# Patient Record
Sex: Male | Born: 1955 | Race: Black or African American | Hispanic: No | Smoking: Former smoker
Health system: Southern US, Community
[De-identification: ages and names within clinical notes are randomized; demographics above are authoritative.]

## PROBLEM LIST (undated history)

## (undated) DIAGNOSIS — E785 Hyperlipidemia, unspecified: Secondary | ICD-10-CM

## (undated) DIAGNOSIS — I509 Heart failure, unspecified: Secondary | ICD-10-CM

## (undated) DIAGNOSIS — F319 Bipolar disorder, unspecified: Secondary | ICD-10-CM

## (undated) DIAGNOSIS — Z720 Tobacco use: Secondary | ICD-10-CM

## (undated) DIAGNOSIS — I10 Essential (primary) hypertension: Secondary | ICD-10-CM

## (undated) DIAGNOSIS — F32A Depression, unspecified: Secondary | ICD-10-CM

## (undated) DIAGNOSIS — F329 Major depressive disorder, single episode, unspecified: Secondary | ICD-10-CM

## (undated) HISTORY — DX: Heart failure, unspecified: I50.9

## (undated) HISTORY — DX: Bipolar disorder, unspecified: F31.9

---
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
## 2011-12-22 ENCOUNTER — Encounter (HOSPITAL_COMMUNITY): Payer: Self-pay | Admitting: *Deleted

## 2011-12-22 ENCOUNTER — Emergency Department (HOSPITAL_COMMUNITY)
Admission: EM | Admit: 2011-12-22 | Discharge: 2011-12-22 | Disposition: A | Discharge status: Home / Self Care | Payer: Self-pay | Attending: Emergency Medicine | Admitting: Emergency Medicine

## 2011-12-22 DIAGNOSIS — M7989 Other specified soft tissue disorders: Secondary | ICD-10-CM

## 2011-12-22 DIAGNOSIS — I10 Essential (primary) hypertension: Secondary | ICD-10-CM | POA: Insufficient documentation

## 2011-12-22 DIAGNOSIS — Z9089 Acquired absence of other organs: Secondary | ICD-10-CM | POA: Insufficient documentation

## 2011-12-22 DIAGNOSIS — R600 Localized edema: Secondary | ICD-10-CM

## 2011-12-22 DIAGNOSIS — R609 Edema, unspecified: Secondary | ICD-10-CM | POA: Insufficient documentation

## 2011-12-22 HISTORY — DX: Essential (primary) hypertension: I10

## 2011-12-22 LAB — BASIC METABOLIC PANEL
Chloride: 102 mEq/L (ref 96–112)
GFR calc Af Amer: >90 mL/min (ref >90)
Potassium: 3.4 mEq/L — ABNORMAL LOW (ref 3.5–5.1)
Sodium: 138 mEq/L (ref 135–145)

## 2011-12-22 LAB — PRO B NATRIURETIC PEPTIDE: Pro B Natriuretic peptide (BNP): 5 pg/mL (ref 0–125)

## 2011-12-22 MED ORDER — FUROSEMIDE 40 MG PO TABS: 40.0000 mg | ORAL_TABLET | Freq: Every day | ORAL | Status: DC

## 2011-12-22 NOTE — ED Notes (Addendum)
Here for bilateral feet swelling, onset 1 week ago, pain onset last night in R foot. No h/o same. Swelling up to knees, pitting +3. Pedal pulses palpable. Other with pt states: "he does not sleep very well, up on his feet all the time, does not take his BP medicine, has not had it in months".

## 2011-12-22 NOTE — Progress Notes (Signed)
*  Preliminary Results* Bilateral lower extremity venous duplex completed. Bilateral lower extremities are negative for deep vein thrombosis.  12/22/2011 9:33 AM Gertie Fey, RDMS, RDCS

## 2011-12-22 NOTE — ED Provider Notes (Signed)
History     CSN: [id number]  Arrival date & time 12/22/11  0442   First MD Initiated Contact with Patient 12/22/11 0544      Chief Complaint  Patient presents with  . Leg Swelling    (Consider location/radiation/quality/duration/timing/severity/associated sxs/prior treatment) The history is provided by the patient and a friend.   Neeraj Housand is a 56 y.o. male presents to the emergency department complaining of bilateral leg swelling.  The onset of the symptoms was  abrupt starting 6 days ago.  The patient has associated pain in the legs.  The symptoms have been  persistent, gradually worsened.  nothing makes the symptoms worse and nothing makes symptoms better.  The patient denies fever, chills, headache, chest pain, shortness of breath, dyspnea, dyspnea on exertion, orthopnea, nausea, vomiting, diarrhea.  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 He states he's on his feet throughout most of the day.  He states last week and his right leg began swelling and the trochars later his left leg had begun to swell as well.  He states he's tried elevation and Epson salt soaks without relief. He states the legs are painful particularly in the feet.  He states he's not been sleeping very well however he does not wake up short of breath or coughing.  He has no difficulty lying flat.  He takes only vitamins D, C, B6, B12.  He denies medical problems.  He states he does not take his BP medications.  Denies SOB, hemoptysis, chest pain.     Past Medical History  Diagnosis Date  . Hypertension     Past Surgical History  Procedure Date  . Splenectomy   . Appendectomy     History reviewed. No pertinent family history.  History  Substance Use Topics  . Smoking status: Never Smoker   . Smokeless tobacco: Not on file  . Alcohol Use: No      Review of Systems  Constitutional: Negative for fever, diaphoresis, appetite change, fatigue and unexpected weight change.  HENT: Negative  for mouth sores and neck stiffness.   Eyes: Negative for visual disturbance.  Respiratory: Negative for cough, chest tightness, shortness of breath and wheezing.   Cardiovascular: Positive for leg swelling. Negative for chest pain.  Gastrointestinal: Negative for nausea, vomiting, abdominal pain, diarrhea and constipation.  Genitourinary: Negative for dysuria, urgency, frequency and hematuria.  Musculoskeletal: Negative for back pain and gait problem.  Skin: Negative for rash.  Neurological: Negative for syncope, light-headedness and headaches.  Psychiatric/Behavioral: Positive for disturbed wake/sleep cycle. The patient is not nervous/anxious.     Allergies  Review of patient's allergies indicates no known allergies.  Home Medications   Current Outpatient Rx  Name Route Sig Dispense Refill  . FUROSEMIDE 40 MG PO TABS Oral Take 1 tablet (40 mg total) by mouth daily. 30 tablet 1    BP 137/82  Pulse 78  Temp 98 F (36.7 C) (Oral)  Resp 16  SpO2 99%  Physical Exam  Nursing note and vitals reviewed. Constitutional: He appears well-developed and well-nourished. No distress.  HENT:  Head: Normocephalic and atraumatic.  Mouth/Throat: Oropharynx is clear and moist. No oropharyngeal exudate.  Eyes: Conjunctivae normal and EOM are normal. Pupils are equal, round, and reactive to light. No scleral icterus.  Neck: Normal range of motion. Neck supple.  Cardiovascular: Normal rate, regular rhythm, S1 normal, S2 normal, normal heart sounds and intact distal pulses.  Exam reveals no gallop and no friction rub.  No murmur heard.      Capillary refill < 3 sec 3+ pitting edema of the legs bilaterally from the forefoot to mid calf, general swelling to above the knees  Pulmonary/Chest: Effort normal and breath sounds normal. No accessory muscle usage. No respiratory distress. He has no decreased breath sounds. He has no wheezes. He has no rhonchi. He has no rales.  Abdominal: Soft. Bowel sounds  are normal. He exhibits no mass. There is no tenderness. There is no rebound and no guarding.  Musculoskeletal: Normal range of motion. He exhibits edema.  Lymphadenopathy:    He has no cervical adenopathy.  Neurological: He is alert. No cranial nerve deficit. He exhibits normal muscle tone. Coordination normal.       Speech is clear and goal oriented Moves extremities without ataxia  Skin: Skin is warm and dry. No rash noted. He is not diaphoretic.  Psychiatric: He has a normal mood and affect.    ED Course  Procedures (including critical care time)  Labs Reviewed  BASIC METABOLIC PANEL - Abnormal; Notable for the following:    Potassium 3.4 (*)     All other components within normal limits  PRO B NATRIURETIC PEPTIDE   No results found.  Results for orders placed during the hospital encounter of 12/22/11  PRO B NATRIURETIC PEPTIDE      Component Value Range   Pro B Natriuretic peptide (BNP) 5.0  0 - 125 pg/mL  BASIC METABOLIC PANEL      Component Value Range   Sodium 138  135 - 145 mEq/L   Potassium 3.4 (*) 3.5 - 5.1 mEq/L   Chloride 102  96 - 112 mEq/L   CO2 28  19 - 32 mEq/L   Glucose, Bld 96  70 - 99 mg/dL   BUN 8  6 - 23 mg/dL   Creatinine, Ser 1.61  0.50 - 1.35 mg/dL   Calcium 9.5  8.4 - 09.6 mg/dL   GFR calc non Af Amer >90  >90 mL/min   GFR calc Af Amer >90  >90 mL/min   No results found.   1. Bilateral leg edema       MDM  Clovis Cao for bilateral leg swelling.  Concern for DVT versus mild heart failure.  Patient low risk for Wells criteria for DVT.  No pulmonary symptoms present.  Alert and oriented, nontoxic nonseptic appearing.  BMP largely unremarkable; mild hypokalemia at 3.4.  I discussed diet choices for increasing this.  BNP WNL; no evidence of heart failure.  Duplex Scan: Bilateral lower extremities are negative for deep vein thrombosis.  Pt is not jaundiced and there is no evidence of liver failure on exam. Will discharge home with a trial of  lasix and have him follow-up with a PCP.  I have given him a resource list to use for finding a PCP.  I have also discussed reasons to return immediately to the ER.  Patient expresses understanding and agrees with plan.  1. Medications: lasix 2. Treatment: rest, adequate hydration, decrease salt intake, can continue salt soaks 3. Follow Up: with a PCP.              Dahlia Client Zackery Brine, PA-C 12/22/11 [phone]

## 2011-12-22 NOTE — ED Notes (Signed)
Pt returned from vascular study.

## 2011-12-22 NOTE — ED Notes (Signed)
Pt denies SOB. Bilateral Lower extremities swollen, and experiencing rt foot pain. Symptoms x 2 weeks.

## 2011-12-23 NOTE — ED Provider Notes (Signed)
Medical screening examination/treatment/procedure(s) were performed by non-physician practitioner and as supervising physician I was immediately available for consultation/collaboration.  Jones Skene, M.D.     Jones Skene, MD 12/23/11 [phone]

## 2011-12-24 ENCOUNTER — Emergency Department (HOSPITAL_COMMUNITY)
Admission: EM | Admit: 2011-12-24 | Discharge: 2011-12-25 | Disposition: A | Discharge status: Home / Self Care | Payer: Self-pay | Attending: Emergency Medicine | Admitting: Emergency Medicine

## 2011-12-24 ENCOUNTER — Encounter (HOSPITAL_COMMUNITY): Payer: Self-pay | Admitting: Nurse Practitioner

## 2011-12-24 ENCOUNTER — Emergency Department (HOSPITAL_COMMUNITY): Payer: Self-pay

## 2011-12-24 DIAGNOSIS — R609 Edema, unspecified: Secondary | ICD-10-CM | POA: Insufficient documentation

## 2011-12-24 DIAGNOSIS — Z79899 Other long term (current) drug therapy: Secondary | ICD-10-CM | POA: Insufficient documentation

## 2011-12-24 DIAGNOSIS — I10 Essential (primary) hypertension: Secondary | ICD-10-CM | POA: Insufficient documentation

## 2011-12-24 LAB — URINALYSIS, ROUTINE W REFLEX MICROSCOPIC
Leukocytes, UA: NEGATIVE
Nitrite: NEGATIVE
Specific Gravity, Urine: 1.031 — ABNORMAL HIGH (ref 1.005–1.030)
pH: 5.5 (ref 5.0–8.0)

## 2011-12-24 NOTE — ED Notes (Signed)
Pt reports he was here Wednesday for swelling in legs, sent home and told to f/u with a PCP. Pt called referrals and the nurse he spoke to told him to return to ed for further eval. States symptoms remain the same as Wednesday: swelling in BLE. C/o pain in R foot. No sob or CP. A&Ox4, resp e/u

## 2011-12-25 NOTE — ED Notes (Signed)
Pt denies any questions upon discharge. 

## 2011-12-25 NOTE — ED Provider Notes (Signed)
History     CSN: [id number]  Arrival date & time 12/24/11  4098   First MD Initiated Contact with Patient 12/24/11 2300      Chief Complaint  Patient presents with  . Leg Swelling    (Consider location/radiation/quality/duration/timing/severity/associated sxs/prior treatment) HPI Pt seen two days ago in ED with c/o lower extremity swelling.  Pt with extensive workup at that time, no signs of chf, no dvt.  Pt was to f/u outpatient, started on lasix.  Pt was told by nurse at new pcm office to go to ED for further eval.  No new symptoms, denies cp, sob.  Legs still swollen.    Past Medical History  Diagnosis Date  . Hypertension     Past Surgical History  Procedure Date  . Splenectomy   . Appendectomy     History reviewed. No pertinent family history.  History  Substance Use Topics  . Smoking status: Never Smoker   . Smokeless tobacco: Not on file  . Alcohol Use: No      Review of Systems  All other systems reviewed and are negative.    Allergies  Review of patient's allergies indicates no known allergies.  Home Medications   Current Outpatient Rx  Name Route Sig Dispense Refill  . FUROSEMIDE 40 MG PO TABS Oral Take 1 tablet (40 mg total) by mouth daily. 30 tablet 1    BP 140/80  Pulse 71  Temp 98.1 F (36.7 C) (Oral)  Resp 18  SpO2 100%  Physical Exam  Nursing note and vitals reviewed. Constitutional: He is oriented to person, place, and time. He appears well-developed and well-nourished.  HENT:  Head: Normocephalic and atraumatic.  Nose: Nose normal.  Mouth/Throat: Oropharynx is clear and moist.  Eyes: Conjunctivae normal and EOM are normal. Pupils are equal, round, and reactive to light.  Neck: Normal range of motion. Neck supple. No JVD present. No tracheal deviation present. No thyromegaly present.  Cardiovascular: Normal rate, regular rhythm, normal heart sounds and intact distal pulses.  Exam reveals no gallop and no friction rub.   No  murmur heard. Pulmonary/Chest: Effort normal and breath sounds normal. No stridor. No respiratory distress. He has no wheezes. He has no rales. He exhibits no tenderness.  Abdominal: Soft. Bowel sounds are normal. He exhibits no distension and no mass. There is no tenderness. There is no rebound and no guarding.  Musculoskeletal: Normal range of motion. He exhibits edema (2+pitting edema to mid shin). He exhibits no tenderness.  Lymphadenopathy:    He has no cervical adenopathy.  Neurological: He is alert and oriented to person, place, and time. He exhibits normal muscle tone. Coordination normal.  Skin: Skin is dry. No rash noted. No erythema. No pallor.  Psychiatric: He has a normal mood and affect. His behavior is normal. Judgment and thought content normal.    ED Course  Procedures (including critical care time)  Labs Reviewed  URINALYSIS, ROUTINE W REFLEX MICROSCOPIC - Abnormal; Notable for the following:    APPearance CLOUDY (*)     Specific Gravity, Urine 1.031 (*)     All other components within normal limits  LAB REPORT - SCANNED   Dg Chest 2 View  12/24/2011  *RADIOLOGY REPORT*  Clinical Data: Bilateral leg swelling  CHEST - 2 VIEW  Comparison: None.  Findings: Bronchitic changes.  No focal consolidation.  No frank interstitial edema.  No pleural effusion or pneumothorax.  Cardiomediastinal silhouette is within normal limits.  Mild degenerative changes of  the visualized thoracolumbar spine.  IMPRESSION: No evidence of acute cardiopulmonary disease.   Original Report Authenticated By: Charline Bills, M.D.      1. Peripheral edema       MDM  Pt with thorough ED workup 2 days ago.  UA and CXR done today.  No signs of fluid overload, no renal disease noted, no dvt, no cellulitis on exam.  Pt on diuretic.  He was noted to be slightly htn on exam today, no medications started at this time.  Wrote script for compression stockings.  Pt informed little workup that can be done  emergently, and will need to f/u with pcm.        Olivia Mackie, MD 12/25/11 2053

## 2012-01-14 ENCOUNTER — Emergency Department (HOSPITAL_COMMUNITY)
Admission: EM | Admit: 2012-01-14 | Discharge: 2012-01-15 | Disposition: A | Discharge status: Home / Self Care | Payer: Self-pay | Attending: Emergency Medicine | Admitting: Emergency Medicine

## 2012-01-14 ENCOUNTER — Encounter (HOSPITAL_COMMUNITY): Payer: Self-pay | Admitting: *Deleted

## 2012-01-14 DIAGNOSIS — I10 Essential (primary) hypertension: Secondary | ICD-10-CM | POA: Insufficient documentation

## 2012-01-14 DIAGNOSIS — F319 Bipolar disorder, unspecified: Secondary | ICD-10-CM | POA: Insufficient documentation

## 2012-01-14 LAB — BASIC METABOLIC PANEL
BUN: 9 mg/dL (ref 6–23)
Chloride: 100 mEq/L (ref 96–112)
GFR calc Af Amer: >90 mL/min (ref >90)
Potassium: 3.5 mEq/L (ref 3.5–5.1)
Sodium: 137 mEq/L (ref 135–145)

## 2012-01-14 LAB — CBC WITH DIFFERENTIAL/PLATELET
Basophils Relative: 1 % (ref 0–1)
Hemoglobin: 13.5 g/dL (ref 13.0–17.0)
Lymphs Abs: 2 10*3/uL (ref 0.7–4.0)
Monocytes Relative: 8 % (ref 3–12)
Neutro Abs: 5 10*3/uL (ref 1.7–7.7)
Neutrophils Relative %: 65 % (ref 43–77)
Platelets: 344 10*3/uL (ref 150–400)
RBC: 4.79 MIL/uL (ref 4.22–5.81)

## 2012-01-14 LAB — RAPID URINE DRUG SCREEN, HOSP PERFORMED
Barbiturates: NOT DETECTED
Benzodiazepines: NOT DETECTED
Cocaine: NOT DETECTED
Opiates: NOT DETECTED

## 2012-01-14 MED ORDER — LORAZEPAM 1 MG PO TABS
1.0000 mg | ORAL_TABLET | Freq: Once | ORAL | Status: AC
  Administered 2012-01-14: 1 mg via ORAL
  Filled 2012-01-14: qty 2

## 2012-01-14 NOTE — ED Provider Notes (Signed)
History     CSN: [id number]  Arrival date & time 01/14/12  1819   First MD Initiated Contact with Patient 01/14/12 1958      Chief Complaint  Patient presents with  . Anxiety   HPI  History provided by the patient and patient's son Francee Piccolo. Patient is a 56 year old male with history of hypertension and possible prior history of bipolar disorder. Patient states he was brought here by his son's because he needed to "cool down". He states that he was an argument with his son and that he gets overly anxious and begins to yell at times. Patient currently reports feeling much better. He does state that his son has kicked him out of the house and he has no place to live. Patient's son, Francee Piccolo spoke with me on the phone and states that he believes patient may have past history of bipolar disorder that he was diagnosed and treated for her while in prison. Patient has been living with him and his other brother off-and-on. Patient stopped taking medications that he was on in prison was coming home stating that this was "demon medicine" and that he would not take it.  Francee Piccolo also reports that his father has been sneaking out at night and wandering the streets. Patient also had reported to him that he he believes everyone "wants him" and that he could get any woman he wanted.  Patient has also spent a long periods of time sitting and talking to himself. Patient currently denies any hallucinations to me. Denies any depression denies any SI or HI.    Past Medical History  Diagnosis Date  . Hypertension     Past Surgical History  Procedure Date  . Splenectomy   . Appendectomy     No family history on file.  History  Substance Use Topics  . Smoking status: Never Smoker   . Smokeless tobacco: Not on file  . Alcohol Use: No      Review of Systems  Psychiatric/Behavioral: Positive for agitation. Negative for suicidal ideas and hallucinations. The patient is nervous/anxious.   All other systems  reviewed and are negative.    Allergies  Review of patient's allergies indicates no known allergies.  Home Medications   Current Outpatient Rx  Name Route Sig Dispense Refill  . FUROSEMIDE 40 MG PO TABS Oral Take 1 tablet (40 mg total) by mouth daily. 30 tablet 1    BP 123/73  Pulse 76  Temp 98.1 F (36.7 C) (Oral)  Resp 18  SpO2 9%  Physical Exam  Nursing note and vitals reviewed. Constitutional: He is oriented to person, place, and time. He appears well-developed and well-nourished. No distress.  HENT:  Head: Normocephalic.  Eyes: EOM are normal. Pupils are equal, round, and reactive to light.  Cardiovascular: Normal rate and regular rhythm.   Pulmonary/Chest: Effort normal and breath sounds normal.  Abdominal: Soft. There is no tenderness.  Musculoskeletal: He exhibits edema. He exhibits no tenderness.  Neurological: He is alert and oriented to person, place, and time.  Skin: Skin is warm. No rash noted. No erythema.  Psychiatric: His mood appears anxious. He is is hyperactive. He is not actively hallucinating. He expresses impulsivity and inappropriate judgment. He expresses no homicidal and no suicidal ideation.    ED Course  Procedures   Results for orders placed during the hospital encounter of 01/14/12  CBC WITH DIFFERENTIAL      Component Value Range   WBC 7.8  4.0 - 10.5 K/uL  RBC 4.79  4.22 - 5.81 MIL/uL   Hemoglobin 13.5  13.0 - 17.0 g/dL   HCT 16.1  09.6 - 04.5 %   MCV 85.2  78.0 - 100.0 fL   MCH 28.2  26.0 - 34.0 pg   MCHC 33.1  30.0 - 36.0 g/dL   RDW 40.9  81.1 - 91.4 %   Platelets 344  150 - 400 K/uL   Neutrophils Relative 65  43 - 77 %   Neutro Abs 5.0  1.7 - 7.7 K/uL   Lymphocytes Relative 26  12 - 46 %   Lymphs Abs 2.0  0.7 - 4.0 K/uL   Monocytes Relative 8  3 - 12 %   Monocytes Absolute 0.6  0.1 - 1.0 K/uL   Eosinophils Relative 1  0 - 5 %   Eosinophils Absolute 0.1  0.0 - 0.7 K/uL   Basophils Relative 1  0 - 1 %   Basophils Absolute  0.0  0.0 - 0.1 K/uL  BASIC METABOLIC PANEL      Component Value Range   Sodium 137  135 - 145 mEq/L   Potassium 3.5  3.5 - 5.1 mEq/L   Chloride 100  96 - 112 mEq/L   CO2 28  19 - 32 mEq/L   Glucose, Bld 79  70 - 99 mg/dL   BUN 9  6 - 23 mg/dL   Creatinine, Ser 7.82  0.50 - 1.35 mg/dL   Calcium 9.9  8.4 - 95.6 mg/dL   GFR calc non Af Amer >90  >90 mL/min   GFR calc Af Amer >90  >90 mL/min  ETHANOL      Component Value Range   Alcohol, Ethyl (B) <11  0 - 11 mg/dL       1. Bipolar 1 disorder       MDM  9:15PM patient seen and evaluated. Patient sitting in chair appears comfortable and calm.   Will plan to have tele-psych consult.  Patient was evaluated by Dr. Gary Fleet.  Patient diagnosed with bipolar 1 disorder with manic curvature is 6. This time patient felt stable and safe to return home. Dr. Trisha Mangle has given recommendations for new medications of Haldol 5 mg at night as well as Tegretol 200 mg twice a day.  Patient given prescriptions for new medications and referrals for outpatient mental health and drug abuse.     Angus Seller, [place] 01/15/12 [phone]

## 2012-01-14 NOTE — ED Notes (Signed)
Anxiety for years.  He feels more anxious today,  He lives in a halfway hiuse

## 2012-01-15 MED ORDER — CARBAMAZEPINE 200 MG PO TABS: 200.0000 mg | ORAL_TABLET | Freq: Two times a day (BID) | ORAL | Status: DC

## 2012-01-15 MED ORDER — HALOPERIDOL 5 MG PO TABS: 5.0000 mg | ORAL_TABLET | Freq: Every day | ORAL | Status: DC

## 2012-01-15 NOTE — ED Notes (Signed)
Pt reports he was in an argument w/ his son and has caused him to be anxious - per son pt has been incarcerated and when he was let out came to live w/ him. Pt has been assisted in obtaining a job w/ [employer] that did not work out d/t pt just sitting and talking to himself. Pt states "all women want me" to PA - states he can "have" any woman he wants. Pt denies A/V hallucinations or SI/HI. Pt calm and cooperative at present.

## 2012-01-15 NOTE — ED Notes (Signed)
Telepsych completed.  

## 2012-01-15 NOTE — ED Provider Notes (Signed)
Medical screening examination/treatment/procedure(s) were performed by non-physician practitioner and as supervising physician I was immediately available for consultation/collaboration.   Nasire Reali, MD 01/15/12 1605 

## 2012-01-15 NOTE — ED Notes (Signed)
Rx given x Pt ambulatin2g independently w/ steady gait on d/c in no acute distress, A&Ox4. D/c instructions reviewed w/ pt - pt denies any further questions or concerns at present.

## 2012-01-16 ENCOUNTER — Emergency Department (HOSPITAL_COMMUNITY)
Admission: EM | Admit: 2012-01-16 | Discharge: 2012-01-17 | Disposition: A | Discharge status: Home / Self Care | Payer: Self-pay | Attending: Emergency Medicine | Admitting: Emergency Medicine

## 2012-01-16 ENCOUNTER — Encounter (HOSPITAL_COMMUNITY): Payer: Self-pay | Admitting: *Deleted

## 2012-01-16 DIAGNOSIS — I10 Essential (primary) hypertension: Secondary | ICD-10-CM | POA: Insufficient documentation

## 2012-01-16 DIAGNOSIS — R45851 Suicidal ideations: Secondary | ICD-10-CM | POA: Insufficient documentation

## 2012-01-16 DIAGNOSIS — F3130 Bipolar disorder, current episode depressed, mild or moderate severity, unspecified: Secondary | ICD-10-CM | POA: Insufficient documentation

## 2012-01-16 HISTORY — DX: Depression, unspecified: F32.A

## 2012-01-16 HISTORY — DX: Bipolar disorder, unspecified: F31.9

## 2012-01-16 HISTORY — DX: Major depressive disorder, single episode, unspecified: F32.9

## 2012-01-16 LAB — COMPREHENSIVE METABOLIC PANEL
AST: 49 U/L — ABNORMAL HIGH (ref 0–37)
Albumin: 3.5 g/dL (ref 3.5–5.2)
BUN: 11 mg/dL (ref 6–23)
Calcium: 9.7 mg/dL (ref 8.4–10.5)
Creatinine, Ser: 0.88 mg/dL (ref 0.50–1.35)
Total Protein: 7.5 g/dL (ref 6.0–8.3)

## 2012-01-16 LAB — RAPID URINE DRUG SCREEN, HOSP PERFORMED
Barbiturates: NOT DETECTED
Benzodiazepines: NOT DETECTED
Cocaine: NOT DETECTED
Opiates: NOT DETECTED
Tetrahydrocannabinol: POSITIVE — AB

## 2012-01-16 LAB — CBC WITH DIFFERENTIAL/PLATELET
Basophils Absolute: 0 10*3/uL (ref 0.0–0.1)
Basophils Relative: 0 % (ref 0–1)
Eosinophils Absolute: 0.1 10*3/uL (ref 0.0–0.7)
Eosinophils Relative: 0 % (ref 0–5)
HCT: 41.3 % (ref 39.0–52.0)
MCH: 28.5 pg (ref 26.0–34.0)
MCHC: 32.9 g/dL (ref 30.0–36.0)
MCV: 86.4 fL (ref 78.0–100.0)
Monocytes Absolute: 0.8 10*3/uL (ref 0.1–1.0)
Monocytes Relative: 7 % (ref 3–12)
Neutro Abs: 9.1 10*3/uL — ABNORMAL HIGH (ref 1.7–7.7)
RDW: 13.2 % (ref 11.5–15.5)

## 2012-01-16 LAB — SALICYLATE LEVEL: Salicylate Lvl: <2 mg/dL — ABNORMAL LOW (ref 2.8–20.0)

## 2012-01-16 MED ORDER — ACETAMINOPHEN 325 MG PO TABS: 650.0000 mg | ORAL_TABLET | ORAL | Status: DC | PRN

## 2012-01-16 MED ORDER — ONDANSETRON HCL 8 MG PO TABS: 4.0000 mg | ORAL_TABLET | Freq: Three times a day (TID) | ORAL | Status: DC | PRN

## 2012-01-16 NOTE — ED Notes (Signed)
PT got problems straightened on Friday with bipolar.  Pt never got medications filled.  PT states he is suicidal without a plan

## 2012-01-16 NOTE — ED Notes (Signed)
Pt wanded °

## 2012-01-16 NOTE — ED Provider Notes (Signed)
History     CSN: [id number]  Arrival date & time 01/16/12  1624   First MD Initiated Contact with Patient 01/16/12 1709      Chief Complaint  Patient presents with  . Suicidal  . Medical Clearance    (Consider location/radiation/quality/duration/timing/severity/associated sxs/prior treatment) The history is provided by the patient. No language interpreter was used.  56 yo male coming in with c/o suicidal ideations with no plan. States that his son kicked him out of the house 3 days ago and he has been homeless living down town. Patient was here on October 11 and prescribed medications for his bipolar. Patient never got the medications filled. Patient states that his son is responsible for his medication. Patient does not know the name of the medication he states to be on. States that when he was in prison in 2008 he was on Abilify. When he was here the other day they prescribe Tegretol 200 twice a day and Haldol 5 mg hour sleep. Patient did have a tele-psych 2 days ago. We will move him to the psych holding and have him tele-psych again for suicidal ideations with no plan. Patient also supposed to be on Lasix 40 a day for lower extremity edema. He has no lower extremity edema today or rails. Past medical history hypertension, bipolar, depression. States that he went to prison for selling crack cocaine but he did not have the habit. He denies drugs or alcohol.  Past Medical History  Diagnosis Date  . Hypertension   . Bipolar 1 disorder   . Depression     Past Surgical History  Procedure Date  . Splenectomy   . Appendectomy     No family history on file.  History  Substance Use Topics  . Smoking status: Never Smoker   . Smokeless tobacco: Not on file  . Alcohol Use: No      Review of Systems  Constitutional: Negative.   HENT: Negative.   Eyes: Negative.   Respiratory: Negative.   Cardiovascular: Negative.   Gastrointestinal: Negative.   Neurological: Negative.     Psychiatric/Behavioral: Negative.   All other systems reviewed and are negative.    Allergies  Review of patient's allergies indicates no known allergies.  Home Medications   Current Outpatient Rx  Name Route Sig Dispense Refill  . FUROSEMIDE 40 MG PO TABS Oral Take 1 tablet (40 mg total) by mouth daily. 30 tablet 1    BP 148/76  Pulse 97  Temp 98.8 F (37.1 C) (Oral)  Resp 18  SpO2 99%  Physical Exam  Nursing note and vitals reviewed. Constitutional: He is oriented to person, place, and time. He appears well-developed and well-nourished.  HENT:  Head: Normocephalic.  Eyes: Conjunctivae normal and EOM are normal. Pupils are equal, round, and reactive to light.  Neck: Normal range of motion. Neck supple.  Cardiovascular: Normal rate.   Pulmonary/Chest: Effort normal.  Abdominal: Soft.  Musculoskeletal: Normal range of motion.  Neurological: He is alert and oriented to person, place, and time.  Skin: Skin is warm and dry.  Psychiatric: He has a normal mood and affect.    ED Course  Procedures (including critical care time)   Labs Reviewed  CBC WITH DIFFERENTIAL  COMPREHENSIVE METABOLIC PANEL  URINE RAPID DRUG SCREEN (HOSP PERFORMED)  ETHANOL  ACETAMINOPHEN LEVEL  SALICYLATE LEVEL   No results found.   No diagnosis found.    MDM  56 year old male with suicidal ideations. Denies plan. Denies alcohol or drugs.  Patient is homeless x3 days. He was here 2 days ago to get a prescription for his bipolar and which he did not get filled. Sitter at bedside.  Holding orders in.  Waiting for telepsych.   1am Report given to Dr. Dierdre Highman.  Patient was put on the meds he was prescribed two days ago that he never got filled.        Remi Haggard, NP 01/18/12 1553

## 2012-01-16 NOTE — ED Notes (Signed)
Pt changing into blue scrubs and requested sitter from house coverage.  No sitter available until 7pm.  Notified charge nurse.  Found EMT to sit with patient.  Paged security.

## 2012-01-17 MED ORDER — CARBAMAZEPINE 200 MG PO TABS: 200.0000 mg | ORAL_TABLET | Freq: Two times a day (BID) | ORAL | Status: DC

## 2012-01-17 MED ORDER — HALOPERIDOL 5 MG PO TABS: 5.0000 mg | ORAL_TABLET | Freq: Every day | ORAL | Status: DC

## 2012-01-17 MED ORDER — HALOPERIDOL 5 MG PO TABS
5.0000 mg | ORAL_TABLET | Freq: Every day | ORAL | Status: DC
  Administered 2012-01-17: 5 mg via ORAL
  Filled 2012-01-17 (×2): qty 1

## 2012-01-17 MED ORDER — FUROSEMIDE 20 MG PO TABS
40.0000 mg | ORAL_TABLET | Freq: Every day | ORAL | Status: DC
  Administered 2012-01-17: 40 mg via ORAL
  Filled 2012-01-17: qty 2

## 2012-01-17 MED ORDER — CARBAMAZEPINE 200 MG PO TABS
200.0000 mg | ORAL_TABLET | Freq: Two times a day (BID) | ORAL | Status: DC
  Administered 2012-01-17 (×2): 200 mg via ORAL
  Filled 2012-01-17 (×4): qty 1

## 2012-01-17 NOTE — BH Assessment (Signed)
Assessment Note   Todd Zavala is an 56 y.o. male that was assessed this day.  This Clinical research associate just learned of pt this AM.  Pt presents to [facility] with SI, no plan and a history of Bipolar Disorder.  Pt presented to [facility] 2 days ago after reporting anxiety over getting into an argument with his son (with whom he was living) because his son kicked him out.  Per son, son was concerned about pt's behavior (wandering up the streets at night) per ED notes.  Pt had a telepsych and was prescribed Haldol and Tegretol.  Pt stated he has not yet filled the medications because he cannot afford them.  Pt denies SI today, stating he feels "refreshed."  Pt did say he has been feeling depressed due to losing his job at [employer] last week because his boss let him go as well as having argument with son and son's wife (which led to pt being kicked out several days ago).  Pt denies HI or psychosis.  Pt denies SA.  However, pt presents with manic symptoms including not sleeping x 3 days, rapid, pressured and tangential speech and having to be redirected to answer questions often during assessment.  Pt was pleasant and cooperative.  Pt had a telepsych ordered that has not yet been completed.  ED nurse to arrange for telepsych.  Completed assessment, assessment notification and faxed to [facility] to log.  Updated ED staff.  Axis I: Bipolar, mixed, Severe Without Psychotic Features Axis II: Deferred Axis III:  Past Medical History  Diagnosis Date  . Hypertension   . Bipolar 1 disorder   . Depression    Axis IV: economic problems, housing problems, occupational problems, other psychosocial or environmental problems, problems related to social environment, problems with access to health care services and problems with primary support group Axis V: 31-40 impairment in reality testing  Past Medical History:  Past Medical History  Diagnosis Date  . Hypertension   . Bipolar 1 disorder   . Depression     Past Surgical History    Procedure Date  . Splenectomy   . Appendectomy     Family History: No family history on file.  Social History:  reports that he has never smoked. He does not have any smokeless tobacco history on file. He reports that he uses illicit drugs (Marijuana). He reports that he does not drink alcohol.  Additional Social History:  Alcohol / Drug Use Pain Medications: na Prescriptions: see MAR Over the Counter: see MAR History of alcohol / drug use?: No history of alcohol / drug abuse (pt denies) Longest period of sobriety (when/how long): pt denies Negative Consequences of Use:  (pt denies) Withdrawal Symptoms:  (pt denies)  CIWA: CIWA-Ar BP: 122/72 mmHg Pulse Rate: 93  COWS:    Allergies: No Known Allergies  Home Medications:  (Not in a hospital admission)  OB/GYN Status:  No LMP for male patient.  General Assessment Data Location of Assessment: [facility] ED Living Arrangements: Other (Comment) (homeless) Can pt return to current living arrangement?: Yes Admission Status: Voluntary Is patient capable of signing voluntary admission?: Yes Transfer from: Acute Hospital Referral Source: Self/Family/Friend  Education Status Is patient currently in school?: No  Risk to self Suicidal Ideation: No-Not Currently/Within Last 6 Months Suicidal Intent: No-Not Currently/Within Last 6 Months Is patient at risk for suicide?: No Suicidal Plan?: No-Not Currently/Within Last 6 Months Access to Means: No What has been your use of drugs/alcohol within the last 12 months?: pt  denies Previous Attempts/Gestures:  (Has had SI in past, no gestures/attempts by report) How many times?: 0  Other Self Harm Risks: pt denies Triggers for Past Attempts: None known Intentional Self Injurious Behavior: None Family Suicide History: No Recent stressful life event(s): Conflict (Comment);Job Loss;Financial Problems (pt kicked out of son's home, lost job, Surveyor, quantity, housing) Persecutory voices/beliefs?:  No Depression: Yes Depression Symptoms: Despondent;Insomnia Substance abuse history and/or treatment for substance abuse?: No Suicide prevention information given to non-admitted patients: Yes  Risk to Others Homicidal Ideation: No Thoughts of Harm to Others: No Current Homicidal Intent: No Current Homicidal Plan: No Access to Homicidal Means: No Identified Victim: na - pt denies History of harm to others?: No Assessment of Violence: None Noted Violent Behavior Description: na - pt calm, cooperative Does patient have access to weapons?: No Criminal Charges Pending?: No Does patient have a court date: No  Psychosis Hallucinations: None noted Delusions: None noted  Mental Status Report Appear/Hygiene: Other (Comment) (casual in scrubs) Eye Contact: Good Motor Activity: Unremarkable Speech: Logical/coherent;Rapid;Pressured;Tangential Level of Consciousness: Alert Mood: Other (Comment) (Euthymic) Affect: Appropriate to circumstance Anxiety Level: None Thought Processes: Coherent;Relevant;Tangential Judgement: Impaired Orientation: Person;Place;Time;Situation Obsessive Compulsive Thoughts/Behaviors: None  Cognitive Functioning Concentration: Decreased Memory: Recent Intact;Remote Intact IQ: Average Insight: Fair Impulse Control: Fair Appetite: Good Weight Loss: 0  Weight Gain: 0  Sleep: Decreased Total Hours of Sleep:  (reports has not slept in 3 days) Vegetative Symptoms: None;Decreased grooming  ADLScreening [facility] Assessment Services) Patient's cognitive ability adequate to safely complete daily activities?: Yes Patient able to express need for assistance with ADLs?: Yes Independently performs ADLs?: Yes (appropriate for developmental age)  Abuse/Neglect [facility]) Physical Abuse: Denies Verbal Abuse: Denies Sexual Abuse: Denies  Prior Inpatient Therapy Prior Inpatient Therapy: No Prior Therapy Dates: na Prior Therapy Facilty/Provider(s): na Reason for  Treatment: na  Prior Outpatient Therapy Prior Outpatient Therapy: Yes Prior Therapy Dates: 2001-2009 Prior Therapy Facilty/Provider(s): Counselors/Psychiatrists while incarcerated in [place] Reason for Treatment: Bipolar Diisorder  ADL Screening (condition at time of admission) Patient's cognitive ability adequate to safely complete daily activities?: Yes Patient able to express need for assistance with ADLs?: Yes Independently performs ADLs?: Yes (appropriate for developmental age) Weakness of Legs: None Weakness of Arms/Hands: None  Home Assistive Devices/Equipment Home Assistive Devices/Equipment: None  Therapy Consults (therapy consults require a physician order) PT Evaluation Needed: No OT Evalulation Needed: No SLP Evaluation Needed: No Abuse/Neglect Assessment (Assessment to be complete while patient is alone) Physical Abuse: Denies Verbal Abuse: Denies Sexual Abuse: Denies Exploitation of patient/patient's resources: Denies Self-Neglect: Denies Values / Beliefs Cultural Requests During Hospitalization: None Spiritual Requests During Hospitalization: None Consults Spiritual Care Consult Needed: No Social Work Consult Needed: No Merchant navy officer (For Healthcare) Advance Directive: Patient does not have advance directive;Patient would not like information    Additional Information 1:1 In Past 12 Months?: No CIRT Risk: No Elopement Risk: No Does patient have medical clearance?: Yes     Disposition:  Disposition Disposition of Patient: Other dispositions Other disposition(s): Other (Comment) (Pending telepsych)  On Site Evaluation by:   Reviewed with Physician:  Thornton Papas, Rennis Harding 01/17/2012 9:27 AM

## 2012-01-17 NOTE — BH Assessment (Signed)
Assessment Note  Update:  Pt received telepsych recommending pt be discharged with outpatient referrals and his meds were also adjusted.  Telepsych also recommended pt be referred to SW for homeless resources and Care Management for medication (indigent med programs).  Writer called and spoke with Harper, SW, who stated either she or the next SW on shift, Lennox Laity, would give pt referrals.  ED nurse to contact CM for medication needs.  Pt was given outpatient referrals and signed a No-Harm contract with this clinician.  Pt to be discharged per EDP Pickering once given resources.  Updated assessment notification, completed assessment notification and faxed to [facility] to log.     Disposition:  Disposition Disposition of Patient: Referred to;Outpatient treatment;Other dispositions Other disposition(s): Other (Comment) (Also referred to SW and Care Management)  On Site Evaluation by:   Reviewed with Physician:  Thornton Papas, Rennis Harding 01/17/2012 2:33 PM

## 2012-01-17 NOTE — ED Notes (Signed)
Clean linen is placed on pt bed while is taking a shower.

## 2012-01-17 NOTE — ED Notes (Signed)
Pt states he has not had a bath in 3 days. Pt was given soap,deod,shampoo,towels, wash rag and fresh scrubs and sox.

## 2012-01-17 NOTE — ED Notes (Signed)
Patient discharged with informations for homeless shelters and outpatient treatment information. Patient given two (2) bus passes by Child psychotherapist.

## 2012-01-17 NOTE — ED Notes (Signed)
Patient resting while eating breakfast and talking with sitter with NAD.

## 2012-01-17 NOTE — Progress Notes (Signed)
This afternoon I met with Mr. Todd Zavala to refer to a homeless shelter. Since the shelters in [place] were reportedly full, I offered to refer Mr. Todd Zavala to the shelter in [place]. Mr. Todd Zavala declined due to not being familiar with that area and having some resources in [place]. Mr. Todd Zavala has decided to remain in this area and make his own arrangements. He reports that he will contact his Engineer, drilling and church for support as well. I provided a list of homeless shelters with contact information and two bus tickets.  Gretta Cool Antoin, Marine scientist, Clinical Social Work

## 2012-01-17 NOTE — ED Provider Notes (Signed)
  Physical Exam  BP 122/72  Pulse 93  Temp 97 F (36.1 C) (Oral)  Resp 20  Ht 5\' 11"  (1.803 m)  Wt 164 lb (74.39 kg)  BMI 22.87 kg/m2  SpO2 99%  Physical Exam  ED Course  Procedures  MDM Pending evaluation by ACT      Juliet Rude. Rubin Payor, MD 01/17/12 [phone]

## 2012-01-17 NOTE — ED Notes (Signed)
Patient was given his belongings and is finishing dinner and then will discharge.

## 2012-01-20 ENCOUNTER — Encounter (HOSPITAL_COMMUNITY): Payer: Self-pay | Admitting: Family Medicine

## 2012-01-20 ENCOUNTER — Emergency Department (HOSPITAL_COMMUNITY)
Admission: EM | Admit: 2012-01-20 | Discharge: 2012-01-20 | Disposition: A | Discharge status: Home / Self Care | Payer: Self-pay | Attending: Emergency Medicine | Admitting: Emergency Medicine

## 2012-01-20 ENCOUNTER — Emergency Department (HOSPITAL_COMMUNITY): Payer: Self-pay

## 2012-01-20 DIAGNOSIS — R609 Edema, unspecified: Secondary | ICD-10-CM | POA: Insufficient documentation

## 2012-01-20 DIAGNOSIS — R600 Localized edema: Secondary | ICD-10-CM

## 2012-01-20 DIAGNOSIS — I10 Essential (primary) hypertension: Secondary | ICD-10-CM | POA: Insufficient documentation

## 2012-01-20 DIAGNOSIS — F3130 Bipolar disorder, current episode depressed, mild or moderate severity, unspecified: Secondary | ICD-10-CM | POA: Insufficient documentation

## 2012-01-20 DIAGNOSIS — Z79899 Other long term (current) drug therapy: Secondary | ICD-10-CM | POA: Insufficient documentation

## 2012-01-20 MED ORDER — FUROSEMIDE 40 MG PO TABS: 40.0000 mg | ORAL_TABLET | Freq: Every day | ORAL | Status: DC

## 2012-01-20 MED ORDER — TRAMADOL HCL 50 MG PO TABS
50.0000 mg | ORAL_TABLET | Freq: Once | ORAL | Status: AC
  Administered 2012-01-20: 50 mg via ORAL
  Filled 2012-01-20: qty 1

## 2012-01-20 MED ORDER — TRAMADOL HCL 50 MG PO TABS: 50.0000 mg | ORAL_TABLET | Freq: Four times a day (QID) | ORAL | Status: DC | PRN

## 2012-01-20 NOTE — ED Provider Notes (Signed)
History     CSN: [id number]  Arrival date & time 01/20/12  7829   First MD Initiated Contact with Patient 01/20/12 [phone]      Chief Complaint  Patient presents with  . Leg Swelling    (Consider location/radiation/quality/duration/timing/severity/associated sxs/prior treatment) HPI  Pt to the ER with complaints of persistent leg swelling. He has been seen here a few times for the same. He tells me that he only has one lasix pill left and that he needs refills of his medications. He did not get the compression stalking prescription filled that was written for him. He is unsure as to why he did not get it. He says that the swelling is not different then his normal swelling symptoms and that he has been taking his medicine. Denies injury, erythema, fevers, chills, weakness, CP,  SOB, or worsening of the swelling. Nad/vss  Past Medical History  Diagnosis Date  . Hypertension   . Bipolar 1 disorder   . Depression     Past Surgical History  Procedure Date  . Splenectomy   . Appendectomy     History reviewed. No pertinent family history.  History  Substance Use Topics  . Smoking status: Never Smoker   . Smokeless tobacco: Not on file  . Alcohol Use: No      Review of Systems   Review of Systems  Gen: no weight loss, fevers, chills, night sweats  Eyes: no discharge or drainage, no occular pain or visual changes  Nose: no epistaxis or rhinorrhea  Mouth: no dental pain, no sore throat  Neck: no neck pain  Lungs:No wheezing, coughing or hemoptysis CV: no chest pain, palpitations, dependent edema or orthopnea  Abd: no abdominal pain, nausea, vomiting  GU: no dysuria or gross hematuria  MSK:  LE swelling Neuro: no headache, no focal neurologic deficits  Skin: no abnormalities Psyche: negative.    Allergies  Review of patient's allergies indicates no known allergies.  Home Medications   Current Outpatient Rx  Name Route Sig Dispense Refill  . CARBAMAZEPINE 200  MG PO TABS Oral Take 1 tablet (200 mg total) by mouth 2 (two) times daily. 60 tablet 0  . VITAMIN D 1000 UNITS PO TABS Oral Take 1,000 Units by mouth daily.    Marland Kitchen HALOPERIDOL 5 MG PO TABS Oral Take 1 tablet (5 mg total) by mouth at bedtime. 30 tablet 0  . ADULT MULTIVITAMIN W/MINERALS CH Oral Take 1 tablet by mouth daily.    Marland Kitchen VITAMIN B-6 100 MG PO TABS Oral Take 100 mg by mouth daily.    Marland Kitchen VITAMIN B-12 1000 MCG PO TABS Oral Take 1,000 mcg by mouth daily.    Marland Kitchen VITAMIN E 100 UNITS PO CAPS Oral Take 100 Units by mouth daily.    . FUROSEMIDE 40 MG PO TABS Oral Take 1 tablet (40 mg total) by mouth daily. 30 tablet 1  . TRAMADOL HCL 50 MG PO TABS Oral Take 1 tablet (50 mg total) by mouth every 6 (six) hours as needed for pain. 8 tablet 0    BP 117/73  Pulse 107  Temp 98.2 F (36.8 C) (Oral)  Resp 18  SpO2 97%  Physical Exam  Nursing note and vitals reviewed. Constitutional: He appears well-developed and well-nourished. No distress.  HENT:  Head: Normocephalic and atraumatic.  Eyes: Pupils are equal, round, and reactive to light.  Neck: Normal range of motion. Neck supple.  Cardiovascular: Normal rate and regular rhythm.   Pulmonary/Chest: Effort normal.  Abdominal: Soft.  Musculoskeletal:       Feet:  Neurological: He is alert.  Skin: Skin is warm and dry.  Psychiatric:       Odd affect    ED Course  Procedures (including critical care time)  Labs Reviewed - No data to display Dg Foot Complete Right  01/20/2012  *RADIOLOGY REPORT*  Clinical Data: Pain and swelling  RIGHT FOOT COMPLETE - 3+ VIEW  Comparison: None.  Findings:  Frontal, oblique, lateral views were obtained.  No fracture or dislocation.  Joint spaces appear intact.  No erosive change.  On the lateral view, there is a the and linear opacity which may represent either cassette artifact or a possible foreign body in the volar aspect of the mid foot.  IMPRESSION: Question cassette artifact on the lateral view versus  linear radiopaque foreign body in the volar mid foot region.  If there is any concern for radiopaque foreign body in this area, repeat image of the lateral right foot may be advisable.  Note that this finding is not seen on other views.  Elsewhere, there is no fracture.  No bony destruction.   Original Report Authenticated By: Arvin Collard. WOODRUFF III, M.D.      1. Lower extremity edema       MDM  Chronic LE swelling of ankles and feet. I suspect patient needs refills of his medications and some pain medication.  Rx: Lasix 40mg  tab q day Compression Stalkings Tramadol (6 tabs)  Pt has been advised of the symptoms that warrant their return to the ED. Patient has voiced understanding and has agreed to follow-up with the PCP or specialist.        Dorthula Matas, PA 01/20/12 1110

## 2012-01-20 NOTE — ED Notes (Signed)
Per pt persistent right leg swelling. Seen here for the same before and not improved.

## 2012-01-20 NOTE — ED Provider Notes (Signed)
Medical screening examination/treatment/procedure(s) were performed by non-physician practitioner and as supervising physician I was immediately available for consultation/collaboration.   Joya Gaskins, MD 01/20/12 [phone]

## 2012-01-20 NOTE — ED Notes (Signed)
Patient transported to X-ray 

## 2012-01-25 ENCOUNTER — Emergency Department (HOSPITAL_COMMUNITY): Payer: Self-pay

## 2012-01-25 ENCOUNTER — Emergency Department (HOSPITAL_COMMUNITY)
Admission: EM | Admit: 2012-01-25 | Discharge: 2012-01-25 | Disposition: A | Discharge status: Home / Self Care | Payer: Self-pay | Attending: Emergency Medicine | Admitting: Emergency Medicine

## 2012-01-25 DIAGNOSIS — Z8659 Personal history of other mental and behavioral disorders: Secondary | ICD-10-CM | POA: Insufficient documentation

## 2012-01-25 DIAGNOSIS — F319 Bipolar disorder, unspecified: Secondary | ICD-10-CM | POA: Insufficient documentation

## 2012-01-25 DIAGNOSIS — Z79899 Other long term (current) drug therapy: Secondary | ICD-10-CM | POA: Insufficient documentation

## 2012-01-25 DIAGNOSIS — I10 Essential (primary) hypertension: Secondary | ICD-10-CM | POA: Insufficient documentation

## 2012-01-25 DIAGNOSIS — R609 Edema, unspecified: Secondary | ICD-10-CM | POA: Insufficient documentation

## 2012-01-25 LAB — COMPREHENSIVE METABOLIC PANEL
Albumin: 3.5 g/dL (ref 3.5–5.2)
Alkaline Phosphatase: 67 U/L (ref 39–117)
BUN: 5 mg/dL — ABNORMAL LOW (ref 6–23)
Calcium: 9.4 mg/dL (ref 8.4–10.5)
Creatinine, Ser: 0.85 mg/dL (ref 0.50–1.35)
GFR calc Af Amer: >90 mL/min (ref >90)
Glucose, Bld: 105 mg/dL — ABNORMAL HIGH (ref 70–99)
Total Protein: 7.8 g/dL (ref 6.0–8.3)

## 2012-01-25 LAB — PRO B NATRIURETIC PEPTIDE: Pro B Natriuretic peptide (BNP): 12.9 pg/mL (ref 0–125)

## 2012-01-25 LAB — CBC
HCT: 40.3 % (ref 39.0–52.0)
Hemoglobin: 13.3 g/dL (ref 13.0–17.0)
MCH: 27.9 pg (ref 26.0–34.0)
MCHC: 33 g/dL (ref 30.0–36.0)
MCV: 84.7 fL (ref 78.0–100.0)
RDW: 13.1 % (ref 11.5–15.5)

## 2012-01-25 LAB — TSH: TSH: 2.576 u[IU]/mL (ref 0.350–4.500)

## 2012-01-25 LAB — TROPONIN I: Troponin I: <0.3 ng/mL (ref <0.30)

## 2012-01-25 MED ORDER — FUROSEMIDE 10 MG/ML IJ SOLN
40.0000 mg | Freq: Once | INTRAMUSCULAR | Status: AC
  Administered 2012-01-25: 40 mg via INTRAVENOUS
  Filled 2012-01-25: qty 4

## 2012-01-25 NOTE — ED Notes (Signed)
The patient is AOx4 and comfortable with his discharge instructions. 

## 2012-01-25 NOTE — ED Notes (Signed)
The patient states that he has been experiencing bilateral, lower, leg edema x 10 days.  He also says that he ran out of his furosemide 3 days ago.

## 2012-01-25 NOTE — ED Provider Notes (Signed)
History     CSN: [id number]  Arrival date & time 01/25/12  0210   First MD Initiated Contact with Patient 01/25/12 [phone]      No chief complaint on file.   (Consider location/radiation/quality/duration/timing/severity/associated sxs/prior treatment) The history is provided by the patient. History Limited By: Pt presnets to the ed with 10 days hx of leg swelling.  no pain.  States that he is out of his lasix x 3 days.  No cp, no sob, no weakness. States the etiology of his swelling has yet to be determined  States is homeless and walks a lot,      Past Medical History  Diagnosis Date  . Hypertension   . Bipolar 1 disorder   . Depression     Past Surgical History  Procedure Date  . Splenectomy   . Appendectomy     No family history on file.  History  Substance Use Topics  . Smoking status: Never Smoker   . Smokeless tobacco: Not on file  . Alcohol Use: No      Review of Systems  Musculoskeletal: Positive for joint swelling.  All other systems reviewed and are negative.    Allergies  Review of patient's allergies indicates no known allergies.  Home Medications   Current Outpatient Rx  Name Route Sig Dispense Refill  . CARBAMAZEPINE 200 MG PO TABS Oral Take 1 tablet (200 mg total) by mouth 2 (two) times daily. 60 tablet 0  . VITAMIN D 1000 UNITS PO TABS Oral Take 1,000 Units by mouth daily.    . FUROSEMIDE 40 MG PO TABS Oral Take 1 tablet (40 mg total) by mouth daily. 30 tablet 1  . HALOPERIDOL 5 MG PO TABS Oral Take 1 tablet (5 mg total) by mouth at bedtime. 30 tablet 0  . ADULT MULTIVITAMIN W/MINERALS CH Oral Take 1 tablet by mouth daily.    Marland Kitchen VITAMIN B-6 100 MG PO TABS Oral Take 100 mg by mouth daily.    . TRAMADOL HCL 50 MG PO TABS Oral Take 1 tablet (50 mg total) by mouth every 6 (six) hours as needed for pain. 8 tablet 0  . VITAMIN B-12 1000 MCG PO TABS Oral Take 1,000 mcg by mouth daily.    Marland Kitchen VITAMIN E 100 UNITS PO CAPS Oral Take 100 Units by mouth  daily.      There were no vitals taken for this visit.  Physical Exam  Constitutional: He is oriented to person, place, and time. He appears well-developed and well-nourished.  HENT:  Head: Normocephalic and atraumatic.  Eyes: Conjunctivae normal are normal. Pupils are equal, round, and reactive to light.  Neck: Normal range of motion. Neck supple.  Cardiovascular: Normal rate, regular rhythm, normal heart sounds and intact distal pulses.   Pulmonary/Chest: Effort normal and breath sounds normal.  Abdominal: Soft. Bowel sounds are normal.  Musculoskeletal: He exhibits edema.       bilat pitting edema to mid shin.    Neurological: He is alert and oriented to person, place, and time.  Skin: Skin is warm and dry.  Psychiatric: He has a normal mood and affect. His behavior is normal. Judgment and thought content normal.    ED Course  Procedures (including critical care time)  Labs Reviewed - No data to display No results found.   No diagnosis found.    Date: 01/25/2012  Rate: 65  Rhythm: normal sinus rhythm  QRS Axis: normal  Intervals: normal  ST/T Wave abnormalities: normal  Conduction Disutrbances: none  Narrative Interpretation: unremarkable    MDM  Improved.  Will dc to fu outpt, ret new/worsening sxs        Louay Myrie Lytle Michaels, MD 01/25/12 [phone]

## 2012-02-07 ENCOUNTER — Encounter (HOSPITAL_COMMUNITY): Payer: Self-pay | Admitting: Emergency Medicine

## 2012-02-07 DIAGNOSIS — Z5900 Homelessness unspecified: Secondary | ICD-10-CM | POA: Insufficient documentation

## 2012-02-07 DIAGNOSIS — M79609 Pain in unspecified limb: Secondary | ICD-10-CM | POA: Insufficient documentation

## 2012-02-07 DIAGNOSIS — M7989 Other specified soft tissue disorders: Principal | ICD-10-CM | POA: Insufficient documentation

## 2012-02-07 DIAGNOSIS — F1210 Cannabis abuse, uncomplicated: Secondary | ICD-10-CM | POA: Insufficient documentation

## 2012-02-07 DIAGNOSIS — E785 Hyperlipidemia, unspecified: Secondary | ICD-10-CM | POA: Insufficient documentation

## 2012-02-07 DIAGNOSIS — F319 Bipolar disorder, unspecified: Secondary | ICD-10-CM | POA: Insufficient documentation

## 2012-02-07 DIAGNOSIS — F17200 Nicotine dependence, unspecified, uncomplicated: Secondary | ICD-10-CM | POA: Insufficient documentation

## 2012-02-07 DIAGNOSIS — I10 Essential (primary) hypertension: Secondary | ICD-10-CM | POA: Insufficient documentation

## 2012-02-07 DIAGNOSIS — Z23 Encounter for immunization: Secondary | ICD-10-CM | POA: Insufficient documentation

## 2012-02-07 NOTE — ED Notes (Signed)
Per pt, having pain and swelling to L leg; pt denies SOB, pt does have swelling to bil LE, L>R

## 2012-02-07 NOTE — ED Notes (Signed)
Per EMS, has had this going on since sept 18th L leg pain and swelling, last time was here was dx with CHF, no SOB; pt has been noncompliant with lasix; vs 160/102, 84, 18

## 2012-02-08 ENCOUNTER — Observation Stay (HOSPITAL_COMMUNITY)
Admission: EM | Admit: 2012-02-08 | Discharge: 2012-02-11 | Disposition: A | Discharge status: Home / Self Care | Payer: Self-pay | Attending: Internal Medicine | Admitting: Internal Medicine

## 2012-02-08 ENCOUNTER — Encounter (HOSPITAL_COMMUNITY): Payer: Self-pay | Admitting: Internal Medicine

## 2012-02-08 DIAGNOSIS — E785 Hyperlipidemia, unspecified: Secondary | ICD-10-CM | POA: Diagnosis present

## 2012-02-08 DIAGNOSIS — I82402 Acute embolism and thrombosis of unspecified deep veins of left lower extremity: Secondary | ICD-10-CM

## 2012-02-08 DIAGNOSIS — R609 Edema, unspecified: Secondary | ICD-10-CM

## 2012-02-08 DIAGNOSIS — R600 Localized edema: Secondary | ICD-10-CM

## 2012-02-08 DIAGNOSIS — Z5900 Homelessness unspecified: Secondary | ICD-10-CM

## 2012-02-08 DIAGNOSIS — M7989 Other specified soft tissue disorders: Secondary | ICD-10-CM

## 2012-02-08 DIAGNOSIS — Z720 Tobacco use: Secondary | ICD-10-CM | POA: Diagnosis present

## 2012-02-08 DIAGNOSIS — M79609 Pain in unspecified limb: Secondary | ICD-10-CM

## 2012-02-08 HISTORY — DX: Tobacco use: Z72.0

## 2012-02-08 HISTORY — DX: Heart failure, unspecified: I50.9

## 2012-02-08 HISTORY — DX: Hyperlipidemia, unspecified: E78.5

## 2012-02-08 LAB — URINALYSIS, MICROSCOPIC ONLY
Bilirubin Urine: NEGATIVE
Glucose, UA: NEGATIVE mg/dL
Hgb urine dipstick: NEGATIVE
Specific Gravity, Urine: 1.015 (ref 1.005–1.030)
Urobilinogen, UA: 0.2 mg/dL (ref 0.0–1.0)
pH: 6.5 (ref 5.0–8.0)

## 2012-02-08 LAB — RAPID URINE DRUG SCREEN, HOSP PERFORMED
Amphetamines: NOT DETECTED
Benzodiazepines: NOT DETECTED
Tetrahydrocannabinol: POSITIVE — AB

## 2012-02-08 LAB — D-DIMER, QUANTITATIVE: D-Dimer, Quant: 1.02 ug/mL-FEU — ABNORMAL HIGH (ref 0.00–0.48)

## 2012-02-08 LAB — BASIC METABOLIC PANEL
BUN: 12 mg/dL (ref 6–23)
Calcium: 9.2 mg/dL (ref 8.4–10.5)
Creatinine, Ser: 0.79 mg/dL (ref 0.50–1.35)
GFR calc Af Amer: >90 mL/min (ref >90)
GFR calc non Af Amer: >90 mL/min (ref >90)
Glucose, Bld: 120 mg/dL — ABNORMAL HIGH (ref 70–99)
Potassium: 3.3 mEq/L — ABNORMAL LOW (ref 3.5–5.1)

## 2012-02-08 LAB — CBC
HCT: 40.7 % (ref 39.0–52.0)
MCHC: 33.7 g/dL (ref 30.0–36.0)
MCV: 84.8 fL (ref 78.0–100.0)
Platelets: 361 10*3/uL (ref 150–400)
RDW: 13.1 % (ref 11.5–15.5)
WBC: 6.5 10*3/uL (ref 4.0–10.5)

## 2012-02-08 LAB — CBC WITH DIFFERENTIAL/PLATELET
Basophils Absolute: 0.1 10*3/uL (ref 0.0–0.1)
Basophils Relative: 1 % (ref 0–1)
Lymphocytes Relative: 23 % (ref 12–46)
MCHC: 33.5 g/dL (ref 30.0–36.0)
Neutro Abs: 4 10*3/uL (ref 1.7–7.7)
Neutrophils Relative %: 66 % (ref 43–77)
Platelets: 353 10*3/uL (ref 150–400)
RDW: 13 % (ref 11.5–15.5)
WBC: 6.1 10*3/uL (ref 4.0–10.5)

## 2012-02-08 LAB — LIPID PANEL
Cholesterol: 223 mg/dL — ABNORMAL HIGH (ref 0–200)
HDL: 89 mg/dL (ref >39)
Total CHOL/HDL Ratio: 2.5 RATIO

## 2012-02-08 LAB — CK TOTAL AND CKMB (NOT AT ARMC)
Relative Index: 1.8 (ref 0.0–2.5)
Relative Index: 1.7 (ref 0.0–2.5)
Total CK: 580 U/L — ABNORMAL HIGH (ref 7–232)

## 2012-02-08 LAB — HEPATIC FUNCTION PANEL
ALT: 39 U/L (ref 0–53)
AST: 55 U/L — ABNORMAL HIGH (ref 0–37)
Albumin: 3.3 g/dL — ABNORMAL LOW (ref 3.5–5.2)
Bilirubin, Direct: <0.1 mg/dL (ref 0.0–0.3)

## 2012-02-08 LAB — CREATININE, SERUM: GFR calc Af Amer: >90 mL/min (ref >90)

## 2012-02-08 MED ORDER — VITAMIN D3 25 MCG (1000 UNIT) PO TABS
1000.0000 [IU] | ORAL_TABLET | Freq: Every day | ORAL | Status: DC
  Administered 2012-02-08 – 2012-02-11 (×4): 1000 [IU] via ORAL
  Filled 2012-02-08 (×4): qty 1

## 2012-02-08 MED ORDER — POTASSIUM CHLORIDE CRYS ER 20 MEQ PO TBCR
40.0000 meq | EXTENDED_RELEASE_TABLET | Freq: Once | ORAL | Status: AC
  Administered 2012-02-08: 40 meq via ORAL
  Filled 2012-02-08: qty 1

## 2012-02-08 MED ORDER — VITAMIN B-12 1000 MCG PO TABS
1000.0000 ug | ORAL_TABLET | Freq: Every day | ORAL | Status: DC
  Administered 2012-02-08 – 2012-02-11 (×4): 1000 ug via ORAL
  Filled 2012-02-08 (×4): qty 1

## 2012-02-08 MED ORDER — ENOXAPARIN SODIUM 80 MG/0.8ML ~~LOC~~ SOLN
1.0000 mg/kg | Freq: Two times a day (BID) | SUBCUTANEOUS | Status: DC
  Administered 2012-02-08: 75 mg via SUBCUTANEOUS
  Filled 2012-02-08 (×2): qty 0.8
  Filled 2012-02-08: qty 1

## 2012-02-08 MED ORDER — HALOPERIDOL 5 MG PO TABS
5.0000 mg | ORAL_TABLET | Freq: Every day | ORAL | Status: DC
  Administered 2012-02-08 – 2012-02-10 (×3): 5 mg via ORAL
  Filled 2012-02-08 (×5): qty 1

## 2012-02-08 MED ORDER — VITAMIN B-6 100 MG PO TABS
100.0000 mg | ORAL_TABLET | Freq: Every day | ORAL | Status: DC
  Administered 2012-02-09 – 2012-02-11 (×3): 100 mg via ORAL
  Filled 2012-02-08 (×4): qty 1

## 2012-02-08 MED ORDER — ADULT MULTIVITAMIN W/MINERALS CH
1.0000 | ORAL_TABLET | Freq: Every day | ORAL | Status: DC
  Administered 2012-02-08 – 2012-02-11 (×4): 1 via ORAL
  Filled 2012-02-08 (×4): qty 1

## 2012-02-08 MED ORDER — FUROSEMIDE 40 MG PO TABS
40.0000 mg | ORAL_TABLET | Freq: Two times a day (BID) | ORAL | Status: DC
Start: 2012-02-08 — End: 2012-02-11
  Administered 2012-02-08 – 2012-02-11 (×6): 40 mg via ORAL
  Filled 2012-02-08 (×9): qty 1

## 2012-02-08 MED ORDER — CARBAMAZEPINE 200 MG PO TABS
200.0000 mg | ORAL_TABLET | Freq: Two times a day (BID) | ORAL | Status: DC
  Administered 2012-02-08 – 2012-02-09 (×3): 200 mg via ORAL
  Filled 2012-02-08 (×5): qty 1

## 2012-02-08 MED ORDER — FUROSEMIDE 20 MG PO TABS
40.0000 mg | ORAL_TABLET | Freq: Once | ORAL | Status: AC
  Administered 2012-02-08: 40 mg via ORAL
  Filled 2012-02-08: qty 2

## 2012-02-08 MED ORDER — VITAMIN E 45 MG (100 UNIT) PO CAPS
100.0000 [IU] | ORAL_CAPSULE | Freq: Every day | ORAL | Status: DC
  Administered 2012-02-08 – 2012-02-11 (×4): 100 [IU] via ORAL
  Filled 2012-02-08 (×4): qty 1

## 2012-02-08 MED ORDER — ONDANSETRON HCL 4 MG/2ML IJ SOLN: 4.0000 mg | Freq: Four times a day (QID) | INTRAMUSCULAR | Status: DC | PRN

## 2012-02-08 MED ORDER — FUROSEMIDE 40 MG PO TABS: 40.0000 mg | ORAL_TABLET | Freq: Every day | ORAL | Status: DC

## 2012-02-08 MED ORDER — FUROSEMIDE 8 MG/ML PO SOLN
40.0000 mg | Freq: Once | ORAL | Status: DC
  Filled 2012-02-08: qty 5

## 2012-02-08 MED ORDER — SODIUM CHLORIDE 0.9 % IJ SOLN
3.0000 mL | Freq: Two times a day (BID) | INTRAMUSCULAR | Status: DC
  Administered 2012-02-08 – 2012-02-11 (×7): 3 mL via INTRAVENOUS

## 2012-02-08 MED ORDER — FUROSEMIDE 10 MG/ML IJ SOLN
40.0000 mg | Freq: Once | INTRAMUSCULAR | Status: AC
  Administered 2012-02-08: 40 mg via INTRAVENOUS

## 2012-02-08 MED ORDER — ENOXAPARIN SODIUM 80 MG/0.8ML ~~LOC~~ SOLN
80.0000 mg | Freq: Two times a day (BID) | SUBCUTANEOUS | Status: DC
  Administered 2012-02-08 – 2012-02-10 (×4): 80 mg via SUBCUTANEOUS
  Filled 2012-02-08 (×6): qty 0.8

## 2012-02-08 NOTE — ED Provider Notes (Signed)
7:24 AM BP 134/85  Pulse 80  Temp 97.8 F (36.6 C) (Oral)  Resp 18  Ht 5\' 11"  (1.803 m)  Wt 164 lb (74.39 kg)  BMI 22.87 kg/m2  SpO2 99%  Assumed care of the patient in CDU.  Patient is here on DVT protocol.  Has history of lower extremity swelling.  Also has history of congestive heart failure.  Patient is unaware of this diagnosis when asked.  He is apparently noncompliant with his Lasix per nursing notes.  Pain has decreased from 8/10- 6/10  CV: RRR, No M/R/G, Peripheral pulses distant to palpation through edema. Dorsalis pedis is minimally palpable. Cap refill <2 seconds. 2 +pitting edema BL Lungs: CTAB Abd: Soft, Non tender, non distended  Plan: r/o DVT  10:31 AM Pulmonary ultrasound note states that veins are noncompressible and DVT cannot be ruled out.  I spoke with Dr. Bernette Mayers about results. I've asked nursing staff to contact cardio vascular lab and have expedited reading.  Otherwise I will consult medicine for anticoagulation purposes the  10:46 AM Patient is agitated and will not stay in his room.  He was repeatedly warned by nursing staff to stay in the room, but got up to walk around the ED anyway. Nursing staff spoke with the tattoo performed the ultrasound study.  She states that the study was greatly inhibited by the patient's peripheral edema.  I'm going to begin IV Lasix, wrapped patient's legs with Ace wraps.  Going to consult medicine for admission of the patient.  Patient is homeless and high risk.  After resolution of peripheral edema patient may repeat the ultrasound.   11:51 AM I spoke with Dr. Saralyn Pilar who has agreed to admit the patient.  Arthor Captain, PA-C 02/08/12 1233

## 2012-02-08 NOTE — ED Notes (Signed)
Pt asked about smoking/ Informed Pt that [place] is a  smoke-free campus/ Set patients expectations about his plan of care.

## 2012-02-08 NOTE — ED Notes (Signed)
Pt. Reports seen here in October and diagnosed with CHF, given lasix and pain medicine. States ran out of pain medicine and legs hurt. Pitting edema in bilateral lower extremities.

## 2012-02-08 NOTE — Progress Notes (Signed)
Pt d-dimer elevated at 1.02.  MD has been made aware.  Will continue to monitor. Nino Glow RN

## 2012-02-08 NOTE — Progress Notes (Signed)
ANTICOAGULATION CONSULT NOTE - Initial Consult  Pharmacy Consult for Lovenox Indication: presumptive LLE DVT  No Known Allergies  Patient Measurements: Height: 5\' 11"  (180.3 cm) Weight: 168 lb 14 oz (76.6 kg) (b scale) IBW/kg (Calculated) : 75.3  Heparin Dosing Weight: 77 kg  Vital Signs: Temp: 97.4 F (36.3 C) (11/05 1530) Temp src: Oral (11/05 1530) BP: 127/87 mmHg (11/05 1530) Pulse Rate: 86  (11/05 1530)  Labs:  Basename 02/08/12 1543 02/08/12 1542 02/08/12 1234 02/08/12 0159  HGB -- 13.7 13.4 --  HCT -- 40.7 40.0 --  PLT -- 361 353 --  APTT -- -- -- 29  LABPROT -- -- -- 13.5  INR -- -- -- 1.04  HEPARINUNFRC -- -- -- --  CREATININE -- 0.80 -- 0.79  CKTOTAL 790* -- -- --  CKMB 14.0* -- -- --  TROPONINI <0.30 -- -- --    Estimated Creatinine Clearance: 107.2 ml/min (by C-G formula based on Cr of 0.8).   Medical History: Past Medical History  Diagnosis Date  . Hypertension   . Bipolar 1 disorder     seen at [facility]  . Depression   . Hyperlipidemia   . Tobacco abuse    Assessment:   56 yr old man with bilateral lower extremity edema, cause uncertain.  Lovenox 75 mg SQ given in ED ~4am today. To continue Lovenox for presumptive DVT.  Dopplers to be repeated after treating acute LE edema.    Goal of Therapy:  Anti-Xa level 0.6-1.2 units/ml 4hrs after LMWH dose given Monitor platelets by anticoagulation protocol: Yes   Plan:    Continue Lovenox with 80 mg SQ q12hrs.   CBC at least q72hrs while on Lovenox.   Follow-up repeat doppler study and further anticoagulation plans.  Dennie Fetters, RPh Pager: [phone] 02/08/2012,5:38 PM

## 2012-02-08 NOTE — Progress Notes (Signed)
CRITICAL VALUE ALERT  Critical value received:  CK, MB 14.0  Date of notification:  02/08/12  Time of notification:  1647   Critical value read back:yes  Nurse who received alert:  Nino Glow RN  MD notified (1st page):  Dr. Saralyn Pilar  Time of first page:  1648  MD notified (2nd page):  Time of second page:  Responding MD:  Dr. Saralyn Pilar  Time MD responded:  [phone]

## 2012-02-08 NOTE — Progress Notes (Signed)
*  Preliminary Results* Bilateral lower extremity venous duplex completed. Bilateral posterior tibial veins and peroneal veins are noncompressible, this may be due to pitting edema, however deep vein thrombosis cannot be excluded.  02/08/2012 9:15 AM Gertie Fey, RDMS, RDCS

## 2012-02-08 NOTE — ED Notes (Signed)
Admitting MD at the bedside.  
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
## 2012-02-08 NOTE — ED Provider Notes (Signed)
Medical screening examination/treatment/procedure(s) were performed by non-physician practitioner and as supervising physician I was immediately available for consultation/collaboration.   Charles B. Sheldon, MD 02/08/12 1702 

## 2012-02-08 NOTE — H&P (Addendum)
Internal Medicine Teaching Service Attending Note Date: 02/08/2012  Patient name: Todd Zavala  Medical record number: [id number]  Date of birth: [birth year]   I have seen and evaluated Todd Zavala and discussed their care with the Residency Team.    Todd Zavala is a 56yo man who presented today to the [facility] ED complaining of a 2 month history of progressively worsening LE edema.  The swelling is bilateral, but worse on the left and progressed form the ankles to the knees.  Associated symptoms include LE pain and tightness.  The pain is described as intermitted and sharp and is mostly contained to the left leg.  He notes pain with palpation of the left leg as well.  When seen, the pain was not present, but at worst it has been a 9/10 and is aggravated by walking.  He noticed that the leg swelling started while he was employed at [employer].  He denies any wound, recent surgery or recent travel/prolonged supine position.   Todd Zavala reports that he has been evaluated in the ED multiple times for this condition and had a LE doppler in 12/2011 which did not show a DVT.  There was concern for CHF and the patient was started on lasix.  However, he has been out of this medication for 1 week and his swelling has consistently gotten worse.  The lasix did help improve the swelling.    He specifically denies chest pain, palpitations, SOB, edema in other parts of his body.  He is currently homeless and lives in a homeless shelter.  He is bipolar and was recently started on tegretol.   For further PMH, PSH, PFSH, ROS and meds/allergies, please see resident note  Physical Exam: Blood pressure 127/87, pulse 86, temperature 97.4 F (36.3 C), temperature source Oral, resp. rate 19, height 5\' 11"  (1.803 m), weight 168 lb 14 oz (76.6 kg), SpO2 99.00%. General appearance: alert, cooperative and no distress Head: Normocephalic, without obvious abnormality, atraumatic Eyes: EOMI, anicteric Neck: no carotid bruit, no  JVD and supple, symmetrical, trachea midline Lungs: clear to auscultation bilaterally and no wheeze Heart: RR, NR, no rub, very soft systolic murmur Abdomen: +BS, NT, ND Extremities: When I saw the patient, his LE were wrapped with bandages to help with swelling.  Above bandages he did have some palpable edema to the knees, he also had tenderness to palpation in the left calf.  The swelling of the LLE was visible worse than the right.  Pulses: Pulses in LE not able to be palpated due to wrapping, Radial pulses 2+ and symmetric Skin: no visible rashes Neurologic: Grossly normal  Lab results: Results for orders placed during the hospital encounter of 02/08/12 (from the past 24 hour(s))  BASIC METABOLIC PANEL     Status: Abnormal   Collection Time   02/08/12  1:59 AM      Component Value Range   Sodium 134 (*) 135 - 145 mEq/L   Potassium 3.3 (*) 3.5 - 5.1 mEq/L   Chloride 98  96 - 112 mEq/L   CO2 28  19 - 32 mEq/L   Glucose, Bld 120 (*) 70 - 99 mg/dL   BUN 12  6 - 23 mg/dL   Creatinine, Ser 4.09  0.50 - 1.35 mg/dL   Calcium 9.2  8.4 - 81.1 mg/dL   GFR calc non Af Amer >90  >90 mL/min   GFR calc Af Amer >90  >90 mL/min  PROTIME-INR     Status: Normal  Collection Time   02/08/12  1:59 AM      Component Value Range   Prothrombin Time 13.5  11.6 - 15.2 seconds   INR 1.04  0.00 - 1.49  APTT     Status: Normal   Collection Time   02/08/12  1:59 AM      Component Value Range   aPTT 29  24 - 37 seconds  HEPATIC FUNCTION PANEL     Status: Abnormal   Collection Time   02/08/12 12:34 PM      Component Value Range   Total Protein 8.0  6.0 - 8.3 g/dL   Albumin 3.3 (*) 3.5 - 5.2 g/dL   AST 55 (*) 0 - 37 U/L   ALT 39  0 - 53 U/L   Alkaline Phosphatase 85  39 - 117 U/L   Total Bilirubin 0.2 (*) 0.3 - 1.2 mg/dL   Bilirubin, Direct <1.6  0.0 - 0.3 mg/dL   Indirect Bilirubin NOT CALCULATED  0.3 - 0.9 mg/dL  CBC WITH DIFFERENTIAL     Status: Normal   Collection Time   02/08/12 12:34 PM       Component Value Range   WBC 6.1  4.0 - 10.5 K/uL   RBC 4.71  4.22 - 5.81 MIL/uL   Hemoglobin 13.4  13.0 - 17.0 g/dL   HCT 10.9  60.4 - 54.0 %   MCV 84.9  78.0 - 100.0 fL   MCH 28.5  26.0 - 34.0 pg   MCHC 33.5  30.0 - 36.0 g/dL   RDW 98.1  19.1 - 47.8 %   Platelets 353  150 - 400 K/uL   Neutrophils Relative 66  43 - 77 %   Neutro Abs 4.0  1.7 - 7.7 K/uL   Lymphocytes Relative 23  12 - 46 %   Lymphs Abs 1.4  0.7 - 4.0 K/uL   Monocytes Relative 9  3 - 12 %   Monocytes Absolute 0.5  0.1 - 1.0 K/uL   Eosinophils Relative 1  0 - 5 %   Eosinophils Absolute 0.1  0.0 - 0.7 K/uL   Basophils Relative 1  0 - 1 %   Basophils Absolute 0.1  0.0 - 0.1 K/uL  LIPID PANEL     Status: Abnormal   Collection Time   02/08/12 12:34 PM      Component Value Range   Cholesterol 223 (*) 0 - 200 mg/dL   Triglycerides 92  <295 mg/dL   HDL 89  >62 mg/dL   Total CHOL/HDL Ratio 2.5     VLDL 18  0 - 40 mg/dL   LDL Cholesterol 130 (*) 0 - 99 mg/dL  CARBAMAZEPINE LEVEL, TOTAL     Status: Normal   Collection Time   02/08/12 12:34 PM      Component Value Range   Carbamazepine Lvl 7.7  4.0 - 12.0 ug/mL  D-DIMER, QUANTITATIVE     Status: Abnormal   Collection Time   02/08/12  1:30 PM      Component Value Range   D-Dimer, Quant 1.02 (*) 0.00 - 0.48 ug/mL-FEU  CBC     Status: Normal   Collection Time   02/08/12  3:42 PM      Component Value Range   WBC 6.5  4.0 - 10.5 K/uL   RBC 4.80  4.22 - 5.81 MIL/uL   Hemoglobin 13.7  13.0 - 17.0 g/dL   HCT 86.5  78.4 - 69.6 %   MCV 84.8  78.0 - 100.0 fL   MCH 28.5  26.0 - 34.0 pg   MCHC 33.7  30.0 - 36.0 g/dL   RDW 47.8  29.5 - 62.1 %   Platelets 361  150 - 400 K/uL  CREATININE, SERUM     Status: Normal   Collection Time   02/08/12  3:42 PM      Component Value Range   Creatinine, Ser 0.80  0.50 - 1.35 mg/dL   GFR calc non Af Amer >90  >90 mL/min   GFR calc Af Amer >90  >90 mL/min  CK TOTAL AND CKMB     Status: Abnormal   Collection Time   02/08/12  3:43 PM       Component Value Range   Total CK 790 (*) 7 - 232 U/L   CK, MB 14.0 (*) 0.3 - 4.0 ng/mL   Relative Index 1.8  0.0 - 2.5  TROPONIN I     Status: Normal   Collection Time   02/08/12  3:43 PM      Component Value Range   Troponin I <0.30  <0.30 ng/mL    Imaging results:  No results found.  Assessment and Plan: I agree with the formulated Assessment and Plan with the following changes:   1. Chronic LE swelling - Admit to tele - Doppler in ED not diagnostic due to patients swelling - IV lasix BID, 40mg  - Repeat doppler when swelling improved - TTE  - daily weight, strict I/O, salt restriction - Wrapping and elevation of legs - CE X 3 - Currently on full dose lovenox for presumed DVT, continue until definitive study available.   2. H/O HTN - Not on medications - BP here in house have been okay, however, he is on lasix - Monitor closely, start medication if indicated  Further plan as per resident note, agree with documented plan  Inez Catalina, MD 11/5/20134:57 PM

## 2012-02-08 NOTE — ED Notes (Addendum)
Pt has swelling bilaterally to his lower legs and feet/ pt c/o pain only while ambulating

## 2012-02-08 NOTE — ED Provider Notes (Signed)
History     CSN: [id number]  Arrival date & time 02/07/12  2308   First MD Initiated Contact with Patient 02/08/12 0145      Chief Complaint  Patient presents with  . Leg Pain    (Consider location/radiation/quality/duration/timing/severity/associated sxs/prior treatment) HPI Comments: Pt is a 56 y/o male with hx of swelling of the LE's X 1.5 months before which time he states he never had this problem who presents with ongoing swelling and pain in the LE's.  He has had eval including LE dopplers of the LE's bilaterally in 9/13 showing no DVT, BNP and CMP 2 weeks ago which were normal and  CXR without fluid overload / CHF.  He has been given lasix, compression stockings which he has not been compliant with and he states that the swelling has been persistent, transiently better with lasix but is now out.   Dneies CP and SOB today.    Patient is a 56 y.o. male presenting with leg pain. The history is provided by the patient.  Leg Pain     Past Medical History  Diagnosis Date  . Hypertension   . Bipolar 1 disorder   . Depression   . CHF (congestive heart failure)     Past Surgical History  Procedure Date  . Splenectomy   . Appendectomy     History reviewed. No pertinent family history.  History  Substance Use Topics  . Smoking status: Never Smoker   . Smokeless tobacco: Not on file  . Alcohol Use: No      Review of Systems  All other systems reviewed and are negative.    Allergies  Review of patient's allergies indicates no known allergies.  Home Medications   Current Outpatient Rx  Name  Route  Sig  Dispense  Refill  . CARBAMAZEPINE 200 MG PO TABS   Oral   Take 1 tablet (200 mg total) by mouth 2 (two) times daily.   60 tablet   0   . VITAMIN D 1000 UNITS PO TABS   Oral   Take 1,000 Units by mouth daily.         . FUROSEMIDE 40 MG PO TABS   Oral   Take 1 tablet (40 mg total) by mouth daily.   30 tablet   1   . HALOPERIDOL 5 MG PO TABS    Oral   Take 1 tablet (5 mg total) by mouth at bedtime.   30 tablet   0   . ADULT MULTIVITAMIN W/MINERALS CH   Oral   Take 1 tablet by mouth daily.         Marland Kitchen VITAMIN B-6 100 MG PO TABS   Oral   Take 100 mg by mouth daily.         . TRAMADOL HCL 50 MG PO TABS   Oral   Take 1 tablet (50 mg total) by mouth every 6 (six) hours as needed for pain.   8 tablet   0   . VITAMIN B-12 1000 MCG PO TABS   Oral   Take 1,000 mcg by mouth daily.         Marland Kitchen VITAMIN E 100 UNITS PO CAPS   Oral   Take 100 Units by mouth daily.           BP 141/75  Pulse 88  Temp 98.3 F (36.8 C) (Oral)  Resp 16  SpO2 99%  Physical Exam  Nursing note and vitals reviewed. Constitutional: He appears  well-developed and well-nourished. No distress.  HENT:  Head: Normocephalic and atraumatic.  Mouth/Throat: Oropharynx is clear and moist. No oropharyngeal exudate.  Eyes: Conjunctivae normal and EOM are normal. Pupils are equal, round, and reactive to light. Right eye exhibits no discharge. Left eye exhibits no discharge. No scleral icterus.  Neck: Normal range of motion. Neck supple. No JVD present. No thyromegaly present.  Cardiovascular: Normal rate, regular rhythm, normal heart sounds and intact distal pulses.  Exam reveals no gallop and no friction rub.   No murmur heard.      Normal cardiac exam, normal pulses at radial arteries bilaterally, normal neck, no JVD  Pulmonary/Chest: Effort normal and breath sounds normal. No respiratory distress. He has no wheezes. He has no rales.  Abdominal: Soft. Bowel sounds are normal. He exhibits no distension and no mass. There is no tenderness.  Musculoskeletal: Normal range of motion. He exhibits edema ( 2+ pitting edema bilaterally - symmetrical). He exhibits no tenderness.  Lymphadenopathy:    He has no cervical adenopathy.  Neurological: He is alert. Coordination normal.  Skin: Skin is warm and dry. No rash noted. No erythema.  Psychiatric: He has a  normal mood and affect. His behavior is normal.    ED Course  Procedures (including critical care time)  Labs Reviewed - No data to display No results found.   No diagnosis found.    MDM  Pt has had full w/u - will get DVT studies in the AM as the pt would fit in moderate to high risk category and needs f/u US.  Otherwise, no other w/u indicated at this time.  No cardiac sx, no pulmonary edema on exam, normal VS other than mild Htn.  Will give lasix.   Change of shift - care signed out to Dr. Janna Arch, MD 02/08/12 [phone]

## 2012-02-09 DIAGNOSIS — R609 Edema, unspecified: Secondary | ICD-10-CM

## 2012-02-09 DIAGNOSIS — Z5900 Homelessness unspecified: Secondary | ICD-10-CM

## 2012-02-09 DIAGNOSIS — E876 Hypokalemia: Secondary | ICD-10-CM

## 2012-02-09 DIAGNOSIS — E785 Hyperlipidemia, unspecified: Secondary | ICD-10-CM

## 2012-02-09 LAB — TROPONIN I: Troponin I: <0.3 ng/mL (ref <0.30)

## 2012-02-09 LAB — COMPREHENSIVE METABOLIC PANEL
ALT: 39 U/L (ref 0–53)
AST: 55 U/L — ABNORMAL HIGH (ref 0–37)
Albumin: 3.4 g/dL — ABNORMAL LOW (ref 3.5–5.2)
Calcium: 9.5 mg/dL (ref 8.4–10.5)
Sodium: 134 mEq/L — ABNORMAL LOW (ref 135–145)
Total Protein: 8.1 g/dL (ref 6.0–8.3)

## 2012-02-09 LAB — CBC
HCT: 40.7 % (ref 39.0–52.0)
Hemoglobin: 13.7 g/dL (ref 13.0–17.0)
MCH: 28.5 pg (ref 26.0–34.0)
MCHC: 33.7 g/dL (ref 30.0–36.0)

## 2012-02-09 LAB — MICROALBUMIN / CREATININE URINE RATIO
Creatinine, Urine: 97.3 mg/dL
Microalb Creat Ratio: 5.1 mg/g (ref 0.0–30.0)
Microalb, Ur: 0.5 mg/dL (ref 0.00–1.89)

## 2012-02-09 LAB — CK TOTAL AND CKMB (NOT AT ARMC)
CK, MB: 10 ng/mL (ref 0.3–4.0)
Total CK: 611 U/L — ABNORMAL HIGH (ref 7–232)

## 2012-02-09 MED ORDER — POTASSIUM CHLORIDE 20 MEQ/15ML (10%) PO LIQD
20.0000 meq | Freq: Every day | ORAL | Status: DC
  Administered 2012-02-09 – 2012-02-11 (×3): 20 meq via ORAL
  Filled 2012-02-09 (×3): qty 15

## 2012-02-09 MED ORDER — ACETAMINOPHEN 325 MG PO TABS
650.0000 mg | ORAL_TABLET | Freq: Four times a day (QID) | ORAL | Status: DC | PRN
  Administered 2012-02-09: 650 mg via ORAL
  Filled 2012-02-09: qty 2

## 2012-02-09 MED ORDER — ARIPIPRAZOLE 10 MG PO TABS: 10.0000 mg | ORAL_TABLET | Freq: Every day | ORAL | Status: DC

## 2012-02-09 NOTE — Progress Notes (Signed)
  Echocardiogram 2D Echocardiogram has been performed.  Labrandon Knoch 02/09/2012, 10:33 AM

## 2012-02-09 NOTE — Progress Notes (Signed)
Utilization Review Completed.   Hernandez Losasso, RN, BSN Nurse Case Manager  [phone]  

## 2012-02-09 NOTE — Progress Notes (Signed)
Subjective: He is feeling better today, notes that his legs are less swollen.  Objective: Vital signs in last 24 hours: Filed Vitals:   02/08/12 2045 02/08/12 2048 02/09/12 0443 02/09/12 1349  BP: 125/71 120/72 146/74 118/86  Pulse: 92 92 104 99  Temp:   97.7 F (36.5 C) 98.1 F (36.7 C)  TempSrc:   Oral Oral  Resp:   18 20  Height:      Weight:   169 lb 9.6 oz (76.93 kg)   SpO2:   100% 100%   Weight change: 4 lb 14 oz (2.21 kg)  Intake/Output Summary (Last 24 hours) at 02/09/12 1725 Last data filed at 02/09/12 1555  Gross per 24 hour  Intake    723 ml  Output   1825 ml  Net  -1102 ml  Vitals reviewed  General appearance: taking a nap with his legs down as I enter the room.  Head: Normocephalic, without obvious abnormality, atraumatic  Eyes: EOMI, anicteric  Lungs: clear to auscultation bilaterally and no wheezing Heart: RRR, no murmur appreciated  Abdomen: +BS, NT, ND  Extremities: LE wrapped with ACE bandages up to his knees bilaterally. Left leg bandage loose, coming undone. Palpable edema above his knees. The swelling of the LLE was worse than the right.  Pulses: Pulses in LE not detected 2/2 wrapping bandage.  Radial pulses 2+ and symmetric  Skin: no visible rashes  Neurologic: Grossly normal  Lab Results: Basic Metabolic Panel:  Lab 02/09/12 2956 02/08/12 1542 02/08/12 0159  NA 134* -- 134*  K 3.4* -- 3.3*  CL 96 -- 98  CO2 28 -- 28  GLUCOSE 125* -- 120*  BUN 14 -- 12  CREATININE 0.87 0.80 --  CALCIUM 9.5 -- 9.2  MG -- -- --  PHOS -- -- --   Liver Function Tests:  Lab 02/09/12 0322 02/08/12 1234  AST 55* 55*  ALT 39 39  ALKPHOS 86 85  BILITOT 0.2* 0.2*  PROT 8.1 8.0  ALBUMIN 3.4* 3.3*   CBC:  Lab 02/09/12 0322 02/08/12 1542 02/08/12 1234  WBC 5.5 6.5 --  NEUTROABS -- -- 4.0  HGB 13.7 13.7 --  HCT 40.7 40.7 --  MCV 84.6 84.8 --  PLT 330 361 --   Cardiac Enzymes:  Lab 02/09/12 0321 02/08/12 2009 02/08/12 1543  CKTOTAL 611* 580* 790*    CKMB 10.0* 10.0* 14.0*  CKMBINDEX -- -- --  TROPONINI <0.30 <0.30 <0.30   D-Dimer:  Lab 02/08/12 1330  DDIMER 1.02*   Fasting Lipid Panel:  Lab 02/08/12 1234  CHOL 223*  HDL 89  LDLCALC 116*  TRIG 92  CHOLHDL 2.5  LDLDIRECT --   Coagulation:  Lab 02/08/12 0159  LABPROT 13.5  INR 1.04   Drugs of Abuse     Component Value Date/Time   LABOPIA NONE DETECTED 02/08/2012 1747   COCAINSCRNUR NONE DETECTED 02/08/2012 1747   LABBENZ NONE DETECTED 02/08/2012 1747   AMPHETMU NONE DETECTED 02/08/2012 1747   THCU POSITIVE* 02/08/2012 1747   LABBARB NONE DETECTED 02/08/2012 1747    Urinalysis:  Lab 02/08/12 1747  COLORURINE YELLOW  LABSPEC 1.015  PHURINE 6.5  GLUCOSEU NEGATIVE  HGBUR NEGATIVE  BILIRUBINUR NEGATIVE  KETONESUR NEGATIVE  PROTEINUR NEGATIVE  UROBILINOGEN 0.2  NITRITE NEGATIVE  LEUKOCYTESUR NEGATIVE   Studies/Results: 02/09/12 2D ECHO: Study Conclusions  - Left ventricle: The cavity size was normal. Systolic function was normal. The estimated ejection fraction was in the range of 55% to 60%. Wall motion was  normal; there were no regional wall motion abnormalities. - Atrial septum: No defect or patent foramen ovale was identified.   Medications: I have reviewed the patient's current medications. Scheduled Meds:   . carbamazepine  200 mg Oral BID  . cholecalciferol  1,000 Units Oral Daily  . enoxaparin (LOVENOX) injection  80 mg Subcutaneous Q12H  . furosemide  40 mg Oral BID  . haloperidol  5 mg Oral QHS  . multivitamin with minerals  1 tablet Oral Daily  . pyridOXINE  100 mg Oral Daily  . sodium chloride  3 mL Intravenous Q12H  . vitamin B-12  1,000 mcg Oral Daily  . vitamin E  100 Units Oral Daily  . [DISCONTINUED] enoxaparin  1 mg/kg Subcutaneous Q12H   Continuous Infusions:  PRN Meds:.ondansetron Assessment/Plan: Pt is a 56 y.o. yo male with a PMHx of homelessness, tobacco abuse, bipolar disease (recently started on Tegretol) who was  admitted on 02/08/2012 with symptoms of lower extremity swelling x 2 months and LLE painful swelling x 1 month. LE [place] in ER was unable to assess for clot in setting of significant overlying edema.   1) Bilateral lower extremity edema - unclear cause at this time. Asymmetrical appear certainly does raise concern for DVT (with Wells score for DVT indicating Low-to-intermediate probability in setting of asymmetric edema and localized pain). He additionally has risk factors for heart failure including untreated hypertension, ongoing tobacco abuse, former alcohol abuse - although no pulmonary symptoms or signs noted on exam. In the rare occasion. Tegretol can also be associated with cardiomyopathy, therefore, given reported recent initiation, will need to evaluate this as a potential cause. Lastly, no history of renal or hepatic dysfunction to cause volume overload. Of note, recent TSH was normal in 01/2012.  Plan:  -  D-dimer elevated - Continue full dose lovenox for empiric treatment of presumed LLE DVT - per pharmacy.  - Echo with normal EF but patient is responding to Lasix. Will continue Lasix for now.  - Will fluid restrict/ sodium restrict his diet.  - CK/CKMB elevated but trended down. Troponin negative x3.  - Keep legs elevated as much as possible.  - Daily weight, strict I/O, low sodium and fluid restricted diet.  - Check UA and microalb/cr - to evaluate for renal protein loss (less expected to be contributing cause).  - After treating the acute LE edema, will plan to repeat doppler ultrasound of lower extremities (to hopefully allow for more complete and accurate view).  - Consult to Psych for possible Tegretol discontinuation. Tegretol level normal but could contribute to LE edema.  - Requested records from [facility] regarding psych medications and recent initiation. Not received as of yet.   2) Hypokalemia - secondary to lasix administration.  - Repleted with liquid   - Will need to  continue to closely monitor with ongoing lasix administration.  - Will check Magnesium.   3) History of hypertension - not currently on outpatient medications.  - Will continue to closely monitor, consider addition of medications (such as HCTZ if no CHF, versus possible ACE-I and BB if CHF present).   4) Hyperlipidemia - not on outpatient treatment. Per ATP III guidelines, his LDL goal is < 130 mg/dL.  -Lipid panel with LDL of 116  5) Homelessness - patient currently is homeless, has been coming to ED multiple times in last few months for medical care because is unable to afford his medications. He is currently living at the [facility]. He also  has issue with transportation.  - Social work to help plug in with community resources.  - Possibly [facility]  for continued care upon discharge.   DVT PPX - low molecular weight heparin - full dose    LOS: 1 day   Ky Barban 02/09/2012, 5:25 PM

## 2012-02-09 NOTE — Progress Notes (Signed)
Internal Medicine Teaching Service Attending Note Date: 02/09/2012  Patient name: Todd Zavala  Medical record number: [id number]  Date of birth: 12-17-1955    This patient has been seen and discussed with the house staff. Please see their note for complete details. I concur with their findings with the following additions/corrections:  No explanation for edema found by TTE and renal function/LFTs normal.  Likely culprit is tegretol, as it is the only new intervention recently added to patients regimen.  Once swelling improved, will also repeat doppler to assess for DVT, though this is unlikely to cause bilateral swelling.  Of note, tegretol can increase thrombosis risk as well.   Unclear per patient how long exactly he has been on tegretol, [facility] records requested.  Will ask our Psychiatry colleagues for advice on a different regimen for his bipolar disorder.   MULLEN, EMILY 02/09/2012, 9:51 PM

## 2012-02-10 DIAGNOSIS — M79609 Pain in unspecified limb: Secondary | ICD-10-CM

## 2012-02-10 DIAGNOSIS — M7989 Other specified soft tissue disorders: Secondary | ICD-10-CM

## 2012-02-10 DIAGNOSIS — F319 Bipolar disorder, unspecified: Secondary | ICD-10-CM

## 2012-02-10 LAB — COMPREHENSIVE METABOLIC PANEL
AST: 43 U/L — ABNORMAL HIGH (ref 0–37)
BUN: 14 mg/dL (ref 6–23)
CO2: 26 mEq/L (ref 19–32)
Calcium: 9.7 mg/dL (ref 8.4–10.5)
Creatinine, Ser: 0.79 mg/dL (ref 0.50–1.35)
GFR calc non Af Amer: >90 mL/min (ref >90)

## 2012-02-10 LAB — CBC
MCH: 28.1 pg (ref 26.0–34.0)
MCV: 84.7 fL (ref 78.0–100.0)
Platelets: 345 10*3/uL (ref 150–400)
RBC: 4.91 MIL/uL (ref 4.22–5.81)
RDW: 12.9 % (ref 11.5–15.5)

## 2012-02-10 MED ORDER — CARBAMAZEPINE 200 MG PO TABS: 150.0000 mg | ORAL_TABLET | Freq: Two times a day (BID) | ORAL | Status: DC

## 2012-02-10 MED ORDER — PNEUMOCOCCAL VAC POLYVALENT 25 MCG/0.5ML IJ INJ
0.5000 mL | INJECTION | INTRAMUSCULAR | Status: AC
  Administered 2012-02-11: 0.5 mL via INTRAMUSCULAR
  Filled 2012-02-10: qty 0.5

## 2012-02-10 MED ORDER — CARBAMAZEPINE 100 MG/5ML PO SUSP
150.0000 mg | Freq: Two times a day (BID) | ORAL | Status: DC
  Administered 2012-02-10 – 2012-02-11 (×3): 150 mg via ORAL
  Filled 2012-02-10 (×4): qty 10

## 2012-02-10 MED ORDER — MENINGOCOCCAL VAC A,C,Y,W-135 ~~LOC~~ INJ
0.5000 mL | INJECTION | Freq: Once | SUBCUTANEOUS | Status: AC
  Administered 2012-02-10: 0.5 mL via SUBCUTANEOUS
  Filled 2012-02-10: qty 0.5

## 2012-02-10 MED ORDER — ENOXAPARIN SODIUM 40 MG/0.4ML ~~LOC~~ SOLN
40.0000 mg | SUBCUTANEOUS | Status: DC
  Administered 2012-02-11: 40 mg via SUBCUTANEOUS
  Filled 2012-02-10 (×2): qty 0.4

## 2012-02-10 NOTE — Progress Notes (Addendum)
Subjective: His legs are less swollen today, he reports minimum to no pain. He requested permission to go outside the hospital for a change in scenery and some "breath of fresh air". He denies chest pain, shortness of breath, fever, or chills.  Objective: Vital signs in last 24 hours: Filed Vitals:   02/09/12 1349 02/09/12 2013 02/10/12 0418 02/10/12 1505  BP: 118/86 124/73 129/83 134/75  Pulse: 99 88 86 79  Temp: 98.1 F (36.7 C) 97.9 F (36.6 C) 97.9 F (36.6 C) 97.9 F (36.6 C)  TempSrc: Oral Oral Oral Oral  Resp: 20 20 20 20   Height:      Weight:   167 lb 8 oz (75.978 kg)   SpO2: 100% 95% 94% 98%   Weight change: -1 lb 6 oz (-0.623 kg)  Intake/Output Summary (Last 24 hours) at 02/10/12 1736 Last data filed at 02/10/12 1304  Gross per 24 hour  Intake    963 ml  Output   1250 ml  Net   -287 ml   Vitals reviewed  General appearance: Standing at the door of his room supporting himself with his walking cane, "waiting for my doctor's visit" he explains.   Head: Normocephalic, without obvious abnormality, atraumatic  Eyes: anicteric  Lungs: clear to auscultation bilaterally and no wheezing  Heart: RRR, no murmur appreciated  Abdomen: +BS, NT, ND  Extremities: LE wrapped with ACE bandages up to his knees bilaterally. Palpable edema above his knees much improved from yesterday. The LLE swelling worse than the right.  Pulses: Pulses in LE not detected 2/2 wrapping bandage. Radial pulses 2+ and symmetric  Skin: no visible rashes  Neurologic: Grossly normal  Lab Results: Basic Metabolic Panel:  Lab 02/10/12 1478 02/09/12 0322  NA 134* 134*  K 3.9 3.4*  CL 95* 96  CO2 26 28  GLUCOSE 165* 125*  BUN 14 14  CREATININE 0.79 0.87  CALCIUM 9.7 9.5  MG -- --  PHOS -- --   Liver Function Tests:  Lab 02/10/12 0806 02/09/12 0322  AST 43* 55*  ALT 39 39  ALKPHOS 84 86  BILITOT 0.2* 0.2*  PROT 8.4* 8.1  ALBUMIN 3.5 3.4*   CBC:  Lab 02/10/12 0806 02/09/12 0322 02/08/12  1234  WBC 4.7 5.5 --  NEUTROABS -- -- 4.0  HGB 13.8 13.7 --  HCT 41.6 40.7 --  MCV 84.7 84.6 --  PLT 345 330 --   Cardiac Enzymes:  Lab 02/09/12 0321 02/08/12 2009 02/08/12 1543  CKTOTAL 611* 580* 790*  CKMB 10.0* 10.0* 14.0*  CKMBINDEX -- -- --  TROPONINI <0.30 <0.30 <0.30   D-Dimer:  Lab 02/08/12 1330  DDIMER 1.02*   Fasting Lipid Panel:  Lab 02/08/12 1234  CHOL 223*  HDL 89  LDLCALC 116*  TRIG 92  CHOLHDL 2.5  LDLDIRECT --   Coagulation:  Lab 02/08/12 0159  LABPROT 13.5  INR 1.04    Urine Drug Screen: Drugs of Abuse     Component Value Date/Time   LABOPIA NONE DETECTED 02/08/2012 1747   COCAINSCRNUR NONE DETECTED 02/08/2012 1747   LABBENZ NONE DETECTED 02/08/2012 1747   AMPHETMU NONE DETECTED 02/08/2012 1747   THCU POSITIVE* 02/08/2012 1747   LABBARB NONE DETECTED 02/08/2012 1747    Urinalysis:  Lab 02/08/12 1747  COLORURINE YELLOW  LABSPEC 1.015  PHURINE 6.5  GLUCOSEU NEGATIVE  HGBUR NEGATIVE  BILIRUBINUR NEGATIVE  KETONESUR NEGATIVE  PROTEINUR NEGATIVE  UROBILINOGEN 0.2  NITRITE NEGATIVE  LEUKOCYTESUR NEGATIVE   Studies/Results: 02/10/12: Repeat  bilateral lower extremity venous duplex Preliminary report: Repeat lower extremity venous duplex performed due to a mild inconclusive result 02/08/2012 due to swelling. Swelling greatly resolved today. Bilateral: No evidence of DVT, superficial thrombosis, or Baker's Cyst.  Medications: I have reviewed the patient's current medications. Scheduled Meds:   . carBAMazepine  150 mg Oral BID  . cholecalciferol  1,000 Units Oral Daily  . enoxaparin (LOVENOX) injection  40 mg Subcutaneous Q24H  . furosemide  40 mg Oral BID  . haloperidol  5 mg Oral QHS  . meningococcal vaccine  0.5 mL Subcutaneous Once  . multivitamin with minerals  1 tablet Oral Daily  . pneumococcal 23 valent vaccine  0.5 mL Intramuscular Tomorrow-1000  . potassium chloride  20 mEq Oral Daily  . pyridOXINE  100 mg Oral Daily  . sodium  chloride  3 mL Intravenous Q12H  . vitamin B-12  1,000 mcg Oral Daily  . vitamin E  100 Units Oral Daily  . [DISCONTINUED] ARIPiprazole  10 mg Oral Daily  . [DISCONTINUED] carbamazepine  200 mg Oral BID  . [DISCONTINUED] carbamazepine  200 mg Oral BID  . [DISCONTINUED] enoxaparin (LOVENOX) injection  80 mg Subcutaneous Q12H   Continuous Infusions:  PRN Meds:.acetaminophen, ondansetron Assessment/Plan: Pt is a 56 y.o. yo male with a PMHx of homelessness, tobacco abuse, bipolar disease (recently started on Tegretol) who was admitted on 02/08/2012 with symptoms of lower extremity swelling x 2 months and LLE painful swelling x 1 month. LE [place] in ER was unable to assess for clot in setting of significant overlying edema.   1) Bilateral lower extremity edema - unclear cause at this time. Asymmetrical appear certainly does raise concern for DVT (with Wells score for DVT indicating Low-to-intermediate probability in setting of asymmetric edema and localized pain). He additionally has risk factors for heart failure including untreated hypertension, ongoing tobacco abuse, former alcohol abuse - although no pulmonary symptoms or signs noted on exam. His 2D echo, however, is unremarkable for CHF. Tegretol can be associated with cardiomyopathy, therefore, given reported recent initiation, will need to evaluate this as a potential cause. Lastly, no history of renal or hepatic dysfunction to cause volume overload. Of note, recent TSH was normal in 01/2012.  - D-dimer elevated  - Repeat bilateral LE Dopplers unremarkable for DVT - Echo with normal EF but patient is responding to Lasix. - CK/CKMB elevated but trended down. Troponin negative x3.  -  UA with no proteinuria. Microalb/cr normal.  Plan:  - Discontinued full dose lovenox for empiric treatment of presumed LLE DVT  - Lovenox 40mg  SQ daily.  - Will continue Lasix for now.  - Will fluid restrict/ sodium restrict his diet.  - Keep legs elevated as much  as possible.  - Daily weight, strict I/O, low sodium and fluid restricted diet.   2) Bipolar disorder. He does not remember when he was diagnosed with bipolar disorder. He is followed at [facility].  - Consult to Psych for recommendation with Tegretol discontinuation.  - Will taper Tegretol slowly, decreased to 150mg  BID today and tomorrow, 100mg  BID afterwards, pt to follow up with [facility] for further tapering.  - He reports taking Abilify. Per psych will consider resuming this medications and starting quetiapine XR 150 mg. - SW, drug manufactures to provide pt with medications assistance.  - Will call [facility] for outpatient follow up and further medication monitoring/assitance.   - Requested records from [facility] regarding psych medications and recent initiation. Not received as of yet. Will  call again tomorrow.  - He was given a one time, one hour pass, unsupervised today as part of his behavioral/spiritual health therapy.  - Pt met with Spiritual care.   3) Hypokalemia - secondary to lasix administration. K normal today.  - Repleted with liquid on 11/6 - Will need to continue to closely monitor with ongoing lasix administration.   4) History of hypertension - not currently on outpatient medications.  - Will continue to closely monitor, consider addition of medications (such as HCTZ if no CHF, versus possible ACE-I and BB if CHF present).   5) Hyperlipidemia - not on outpatient treatment. Per ATP III guidelines, his LDL goal is < 130 mg/dL.  -Lipid panel with LDL of 116   6) Homelessness - patient currently is homeless, has been coming to ED multiple times in last few months for medical care because is unable to afford his medications. He is currently living at the [facility]. He also has issue with transportation.  - Social work to help plug in with community resources.  - Possibly [facility] for continued care upon discharge.   DVT PPX - low molecular weight heparin - prophylaxis dose  given negative doppler.   LOS: 2 days   Ky Barban 02/10/2012, 5:36 PM

## 2012-02-10 NOTE — Progress Notes (Signed)
Clinical Social Work-CSW met with treatment team-received referral-pt from [facility] will contact to hold bed-full assessment to follow- Jodean Lima, [phone]
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
## 2012-02-10 NOTE — Progress Notes (Signed)
VASCULAR LAB PRELIMINARY  PRELIMINARY  PRELIMINARY  PRELIMINARY  Repeat bilateral lower extremity venous duplex  completed.    Preliminary report:  Repeat lower extremity venous duplex performed due to a mild inconclusive result 02/08/2012 due to swelling. Swelling greatly resolved today. Bilateral:  No evidence of DVT, superficial thrombosis, or Baker's Cyst.   Michio Thier, RVS 02/10/2012, 2:33 PM

## 2012-02-10 NOTE — Progress Notes (Signed)
Chaplain visited patient during his routine rounds. Patient was alert and responsive. Patient expressed hope in his recovery. Chaplain shared words of encouragement and comfort with patient. Chaplain will follow-up on patient on 02/10/12. Patient expressed his appreciation for Chaplain's visit and support. Chaplain will continue to spiritual care to patient as needed.

## 2012-02-10 NOTE — Progress Notes (Signed)
Chaplain visited patient. Patient was alert and responsive. Patient expressed strong faith in God and was hopeful to recover from his ailment. Chaplain provided ministry of presence and empathetic listening to patient. Chaplain shared words of hope and encouragement with patient. Chaplain will continue to visit and provide spiritual care to patient as needed. Patient thanked Chaplain for his spiritual support and visit.

## 2012-02-10 NOTE — Progress Notes (Signed)
Psych CSW made two attempts to meet with patient today to conduct assessment; one at 2:15 PM (off floor for medical test)  and another at 3:30 PM (1 hour pass off floor to visit with chaplain.) Writer will revisit tomorrow am.  Carney Bern, [facility] Clinical Social Worker [phone]

## 2012-02-11 LAB — COMPREHENSIVE METABOLIC PANEL
ALT: 44 U/L (ref 0–53)
AST: 51 U/L — ABNORMAL HIGH (ref 0–37)
Albumin: 3.5 g/dL (ref 3.5–5.2)
Alkaline Phosphatase: 81 U/L (ref 39–117)
CO2: 29 mEq/L (ref 19–32)
Chloride: 95 mEq/L — ABNORMAL LOW (ref 96–112)
Creatinine, Ser: 0.82 mg/dL (ref 0.50–1.35)
GFR calc non Af Amer: >90 mL/min (ref >90)
Potassium: 4 mEq/L (ref 3.5–5.1)
Sodium: 135 mEq/L (ref 135–145)
Total Bilirubin: 0.2 mg/dL — ABNORMAL LOW (ref 0.3–1.2)

## 2012-02-11 LAB — CBC
MCV: 84.4 fL (ref 78.0–100.0)
Platelets: 372 10*3/uL (ref 150–400)
RBC: 4.93 MIL/uL (ref 4.22–5.81)
WBC: 5.7 10*3/uL (ref 4.0–10.5)

## 2012-02-11 LAB — CK: Total CK: 360 U/L — ABNORMAL HIGH (ref 7–232)

## 2012-02-11 MED ORDER — ARIPIPRAZOLE 2 MG PO TABS
2.0000 mg | ORAL_TABLET | Freq: Every day | ORAL | Status: DC
  Filled 2012-02-11: qty 1

## 2012-02-11 MED ORDER — FUROSEMIDE 40 MG PO TABS: 20.0000 mg | ORAL_TABLET | Freq: Every day | ORAL | Status: DC

## 2012-02-11 NOTE — Progress Notes (Signed)
Patient's IV and telemetry has been discontinued; patient verbalizes understanding of discharge instructions.  Patient is being discharged to [facility].  Lorretta Harp RN

## 2012-02-11 NOTE — Care Management Note (Signed)
    Page 1 of 1   02/11/2012     2:34:54 PM   CARE MANAGEMENT NOTE 02/11/2012  Patient:  Todd Zavala,Todd Zavala   Account Number:  [id number]  Date Initiated:  02/11/2012  Documentation initiated by:  Tera Mater  Subjective/Objective Assessment:   56yo male admitted with swelling of lower legs.  Pt. resides at the [facility] and plans to return there upon discharge.     Action/Plan:   medication assistance   Anticipated DC Date:  02/11/2012   Anticipated DC Plan:  HOME/SELF CARE      DC Planning Services  CM consult  Medication Assistance      Choice offered to / List presented to:             Status of service:  Completed, signed off Medicare Important Message given?   (If response is "NO", the following Medicare IM given date fields will be blank) Date Medicare IM given:   Date Additional Medicare IM given:    Discharge Disposition:  HOME/SELF CARE  Per UR Regulation:  Reviewed for med. necessity/level of care/duration of stay  If discussed at Long Length of Stay Meetings, dates discussed:    Comments:  02/11/12 1330 This NCM called Main Pharmacy to inquire of eligibility for Indigent fund.  Pt. is eligible for medication assistance.  Tera Mater, RN, BSN Nurse Case Manager [phone]
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
## 2012-02-11 NOTE — Discharge Instructions (Signed)
Keep your legs elevated as much as possible.  Keep your legs wrapped with ACE band while they are still swollen.  Your Lasix dose was decreased to half a tablet ( 20mg ) twice per day. You may discontinue this medication if your leg swelling is gone.  You Tegretol dose was decreased to half a tablet (100mg ) twice per day. You need to go to [facility], your Psychiatrist may decrease the dose of Tegretol to 50mg  twice per day. You are being tapered off this medication as your leg swelling could be related to Tegretol.  Continue taking Haldol and Abilify until your see your psychiatrist at [facility]    Edema Edema is a buildup of fluids. It is most common in the feet, ankles, and legs. This happens more as a person ages. It may affect one or both legs. HOME CARE   Raise (elevate) the legs or ankles above the level of the heart while lying down.  Avoid sitting or standing still for a long time.  Exercise the legs to help the puffiness (swelling) go down.  A low-salt diet may help lessen the puffiness.  Only take medicine as told by your doctor. GET HELP RIGHT AWAY IF:   You develop shortness of breath or chest pain.  You cannot breathe when you lie down.  You have more puffiness that does not go away with treatment.  You develop pain or redness in the areas that are puffy.  You have a temperature by mouth above 102 F (38.9 C), not controlled by medicine.  You gain 3 lb/1.4 kg or more in 1 day or 5 lb/2.3 kg in a week. MAKE SURE YOU:   Understand these instructions.  Will watch your condition.  Will get help right away if you are not doing well or get worse. Document Released: 09/08/2007 Document Revised: 06/14/2011 Document Reviewed: 09/08/2007 [facility] Patient Information 2013 [place], [place].

## 2012-02-11 NOTE — Progress Notes (Signed)
Clinical Social Work Department CLINICAL SOCIAL WORK PSYCHIATRY SERVICE LINE ASSESSMENT 02/11/2012  Patient:  Todd Zavala  Account:  [id number]  Admit Date:  02/07/2012  Clinical Social Worker:  Ronda Fairly, CLINICAL SOCIAL WORKER  Date/Time:  02/11/2012 08:45 AM Referred by:  Physician  Date referred:  02/09/2012 Reason for Referral  Behavioral Health Issues   Presenting Symptoms/Problems (In the person's/family's own words):   Patient states he is here for medical help yet open to discussing his current Bipolar Disorder and Depression. Patient states he is currently staying at the [facility] because he was asked to leave son's home after having an argument with his daughter in law   Abuse/Neglect/Trauma History (check all that apply)  Denies history   Abuse/Neglect/Trauma Comments:   Psychiatric History (check all that apply)  Outpatient treatment  Inpatient/hospitilization   Psychiatric medications:  Abilify, Haldol,   Current Mental Health Hospitalizations/Previous Mental Health History:   Patient reports Bipolar Disorder and Depression   Current provider:   Loura Pardon [facility]   Place and Date:   [facility] 01/24/12   Current Medications:   Scheduled Meds:            . ARIPiprazole  2 mg Oral Daily  . carBAMazepine  150 mg Oral BID  . cholecalciferol  1,000 Units Oral Daily  . enoxaparin (LOVENOX) injection  40 mg Subcutaneous Q24H  . furosemide  40 mg Oral BID  . haloperidol  5 mg Oral QHS  . [COMPLETED] meningococcal vaccine  0.5 mL Subcutaneous Once  . multivitamin with minerals  1 tablet Oral Daily  . [COMPLETED] pneumococcal 23 valent vaccine  0.5 mL Intramuscular Tomorrow-1000  . potassium chloride  20 mEq Oral Daily  . pyridOXINE  100 mg Oral Daily  . sodium chloride  3 mL Intravenous Q12H  . vitamin B-12  1,000 mcg Oral Daily  . vitamin E  100 Units Oral Daily  . [DISCONTINUED] enoxaparin (LOVENOX) injection  80 mg Subcutaneous Q12H    Continuous Infusions:      PRN Meds:.acetaminophen, ondansetron       Previous Impatient Admission/Date/Reason:   Patein reports he was recently hospitalized for suicidal ideation two weeks ago here at [facility]. No other hospitalizations reported.   Emotional Health / Current Symptoms    Suicide/Self Harm  None reported   Suicide attempt in the past:   None reported   Other harmful behavior:   None reported   Psychotic/Dissociative Symptoms  None reported   Other Psychotic/Dissociative Symptoms:    Attention/Behavioral Symptoms  Within Normal Limits   Other Attention / Behavioral Symptoms:    Cognitive Impairment  Within Normal Limits   Other Cognitive Impairment:    Mood and Adjustment  Mood Congruent    Stress, Anxiety, Trauma, Any Recent Loss/Stressor  Current Legal Problems/Pending Court Date   Anxiety (frequency):   Phobia (specify):   Compulsive behavior (specify):   Obsessive behavior (specify):   Other:   Patient reports distress over fact probation officer caught him with a dirty urine as patient had goal to get off probation sooner rather than later.   Substance Abuse/Use  Current substance use   SBIRT completed (please refer for detailed history):  Y  Self-reported substance use:   Patient reports use of THC if he does not take his psychotropic medications.   Urinary Drug Screen Completed:  Y Alcohol level:   <11    Environmental/Housing/Living Arrangement  Shelter   Who is in the home:   Emergency  contact:  Son Ladene Artist and daughter in [place], [place] at [phone]    Patient's Strengths and Goals (patient's own words):   Patient reports he served 10 years without incident  in penitentiary for cocaine sales and came to [facility] in 2009/2010. Patient has coped with mental health diagnosis for over 20 years.  Patient hopes to return to shelter and ultimately to son't home after thinbgs settle down.   Clinical Social Worker's Interpretive Summary:     Patient is 56 YO divorced Africn American Male who reports he was recently hospitalized here for suicidal ideation. Pt presents as clear, with no SI/HI and plans to return to shelter and maintain medication compliance.  CSW and patient talked at length about need for patient to maintain medication complince which will ultimately help him remain free of THC which he enjoys.  Patient does realize thathe tends to isolate, create drama and react negatively when using THC.  Patient will followup at next scheduled appointment at [facility].   Disposition:  Psych Clinical Social Worker signing off

## 2012-02-11 NOTE — Progress Notes (Signed)
Subjective: He felt better today,reported that his legs did not hurt as bad and the swelling was much improved.   Objective: Vital signs in last 24 hours: Filed Vitals:   02/10/12 1505 02/10/12 2016 02/11/12 0430 02/11/12 1044  BP: 134/75 124/69 133/85 140/78  Pulse: 79 79 78 81  Temp: 97.9 F (36.6 C) 97.6 F (36.4 C) 97.9 F (36.6 C) 97.5 F (36.4 C)  TempSrc: Oral Oral Oral Oral  Resp: 20 20 19 18   Height:      Weight:   168 lb 3.2 oz (76.295 kg)   SpO2: 98% 96% 98% 99%   Weight change: 11.2 oz (0.318 kg)  Intake/Output Summary (Last 24 hours) at 02/11/12 1416 Last data filed at 02/11/12 1100  Gross per 24 hour  Intake   1446 ml  Output   1525 ml  Net    -79 ml   Vitals reviewed  General appearance: Resting in bed with his legs propped up on pillows.  Eyes: anicteric  Lungs: clear to auscultation bilaterally and no wheezing  Heart: RRR, no murmur appreciated  Abdomen: +BS, NT, ND  Extremities: LE wrapped with ACE bandages up to his knees bilaterally. Palpable edema above his knees much improved from yesterday. The LLE swelling worse than the right.  Pulses: Pulses in LE not detected 2/2 wrapping bandage. Radial pulses 2+ and symmetric  Skin: no visible rashes  Neurologic: Grossly normal  Lab Results: Basic Metabolic Panel:  Lab 02/11/12 1610 02/10/12 0806  NA 135 134*  K 4.0 3.9  CL 95* 95*  CO2 29 26  GLUCOSE 93 165*  BUN 14 14  CREATININE 0.82 0.79  CALCIUM 9.8 9.7  MG -- --  PHOS -- --   Liver Function Tests:  Lab 02/11/12 0500 02/10/12 0806  AST 51* 43*  ALT 44 39  ALKPHOS 81 84  BILITOT 0.2* 0.2*  PROT 8.4* 8.4*  ALBUMIN 3.5 3.5   CBC:  Lab 02/11/12 0500 02/10/12 0806 02/08/12 1234  WBC 5.7 4.7 --  NEUTROABS -- -- 4.0  HGB 14.0 13.8 --  HCT 41.6 41.6 --  MCV 84.4 84.7 --  PLT 372 345 --   Cardiac Enzymes:  Lab 02/11/12 0500 02/09/12 0321 02/08/12 2009 02/08/12 1543  CKTOTAL 360* 611* 580* --  CKMB -- 10.0* 10.0* 14.0*  CKMBINDEX  -- -- -- --  TROPONINI -- <0.30 <0.30 <0.30   D-Dimer:  Lab 02/08/12 1330  DDIMER 1.02*   Fasting Lipid Panel:  Lab 02/08/12 1234  CHOL 223*  HDL 89  LDLCALC 116*  TRIG 92  CHOLHDL 2.5  LDLDIRECT --   Coagulation:  Lab 02/08/12 0159  LABPROT 13.5  INR 1.04    Drugs of Abuse     Component Value Date/Time   LABOPIA NONE DETECTED 02/08/2012 1747   COCAINSCRNUR NONE DETECTED 02/08/2012 1747   LABBENZ NONE DETECTED 02/08/2012 1747   AMPHETMU NONE DETECTED 02/08/2012 1747   THCU POSITIVE* 02/08/2012 1747   LABBARB NONE DETECTED 02/08/2012 1747    Urinalysis:  Lab 02/08/12 1747  COLORURINE YELLOW  LABSPEC 1.015  PHURINE 6.5  GLUCOSEU NEGATIVE  HGBUR NEGATIVE  BILIRUBINUR NEGATIVE  KETONESUR NEGATIVE  PROTEINUR NEGATIVE  UROBILINOGEN 0.2  NITRITE NEGATIVE  LEUKOCYTESUR NEGATIVE   Medications: I have reviewed the patient's current medications. Scheduled Meds:   . ARIPiprazole  2 mg Oral Daily  . carBAMazepine  150 mg Oral BID  . cholecalciferol  1,000 Units Oral Daily  . enoxaparin (LOVENOX) injection  40  mg Subcutaneous Q24H  . furosemide  40 mg Oral BID  . haloperidol  5 mg Oral QHS  . [COMPLETED] meningococcal vaccine  0.5 mL Subcutaneous Once  . multivitamin with minerals  1 tablet Oral Daily  . [COMPLETED] pneumococcal 23 valent vaccine  0.5 mL Intramuscular Tomorrow-1000  . potassium chloride  20 mEq Oral Daily  . pyridOXINE  100 mg Oral Daily  . sodium chloride  3 mL Intravenous Q12H  . vitamin B-12  1,000 mcg Oral Daily  . vitamin E  100 Units Oral Daily  . [DISCONTINUED] enoxaparin (LOVENOX) injection  80 mg Subcutaneous Q12H   Continuous Infusions:  PRN Meds:.acetaminophen, ondansetron Assessment/Plan: Pt is a 56 y.o. yo male with a PMHx of homelessness, tobacco abuse, bipolar disease (recently started on Tegretol) who was admitted on 02/08/2012 with symptoms of lower extremity swelling x 2 months and LLE painful swelling x 1 month. LE [place] in ER was  unable to assess for clot in setting of significant overlying edema.   1) Bilateral lower extremity edema - Etiology is still unclear. Asymmetrical appear certainly does raise concern for DVT (with Wells score for DVT indicating Low-to-intermediate probability in setting of asymmetric edema and localized pain). He additionally has risk factors for heart failure including untreated hypertension, ongoing tobacco abuse, former alcohol abuse - although no pulmonary symptoms or signs noted on exam. His 2D echo, however, is unremarkable for CHF. Tegretol can be associated with cardiomyopathy, therefore, given reported recent initiation, will need to evaluate this as a potential cause. Lastly, no history of renal or hepatic dysfunction to cause volume overload. Of note, recent TSH was normal in 01/2012.  - D-dimer elevated  - Repeat bilateral LE Dopplers unremarkable for DVT  - Discontinued full dose lovenox for empiric treatment of presumed LLE DVT  - Echo with normal EF but patient is responding to Lasix.  - CK/CKMB elevated but trended down. Troponin negative x3.  - UA with no proteinuria. Microalb/cr normal.  Plan:  - Lovenox 40mg  SQ daily for DVT prophylaxis - Will continue Lasix for now, low dose on discharge at 20mg  BID, patient has filled this medications recently.   - Will fluid restrict/ sodium restrict his diet.  - Keep legs elevated as much as possible.  - Daily weight, strict I/O, low sodium and fluid restricted diet.   2) Bipolar disorder. He does not remember when he was diagnosed with bipolar disorder. He is followed at [facility].  Consulted Psychiatry for recommendation with Tegretol discontinuation. Requested records from [facility] regarding psych medications and recent initiation but they have not been received as of yet. He was given a one time, one hour pass, unsupervised today as part of his behavioral/spiritual health therapy. He has met with Spiritual care. Patient reported that he had  already contacted [facility] and has an appointment with his psychiatrist on 02/17/12.   -Will taper Tegretol slowly, decreased to 150mg  BID on 11/08 and 11/09, 100mg  BID afterwards, pt to follow up with [facility] for further tapering.  - He reports taking Abilify 10mg  daily. Per psych will resume this medication and recommend initiation of quetiapine XR 150 mg in outpatient setting by [facility] psychiatrist.  - Patient reports having assistance with psychiatry medications and will not need SW, drug manufactures to provide pt with medications assistance at this time.  - Continue Haldol   3) Hypokalemia - secondary to lasix administration. K normal today.  - Repleted with liquid on 11/6  - Will need to continue  to closely monitor with ongoing lasix administration.  - Will follow up at the [facility].   4) History of hypertension - not currently on outpatient medications.  - Will continue to closely monitor, consider addition of medications (such as HCTZ if no CHF, versus possible ACE-I and BB if CHF present).   5) Hyperlipidemia - not on outpatient treatment. Per ATP III guidelines, his LDL goal is < 130 mg/dL.  -Lipid panel with LDL of 116, no need for medication at this time.   6) Homelessness - patient currently is homeless, has been coming to ED multiple times in last few months for medical care because is unable to afford his medications. He is currently living at the [facility]. He also has issue with transportation. He reports today having filled all his medications recently except for Lasix.  - Follow up at the  [facility] for continued care upon discharge. Clinic social work will be consulted to help with community resources.   DVT PPX - low molecular weight heparin - prophylaxis dose given negative doppler.      LOS: 3 days   Ky Barban 02/11/2012, 2:16 PM

## 2012-02-11 NOTE — Discharge Summary (Signed)
Internal Medicine Teaching [facility] Discharge Note  Name: Todd Zavala MRN: [id number] DOB: [birth year] 56 y.o.  Date of Admission: 02/08/2012  1:11 AM Date of Discharge: 02/11/2012 Attending Physician: Inez Catalina, MD  Discharge Diagnosis: Principal Problem:  *Bilateral lower extremity edema Active Problems:  Left leg DVT  Hyperlipidemia  Tobacco abuse  Homelessness   Discharge Medications:   Medication List     As of 02/11/2012  2:26 PM    TAKE these medications         ARIPiprazole 10 MG tablet   Commonly known as: ABILIFY   Take 10 mg by mouth daily.      carbamazepine 100 MG tablet   Commonly known as: TEGRETOL   Take 1 tablet (100 mg total) by mouth 2 (two) times daily.      cholecalciferol 1000 UNITS tablet   Commonly known as: VITAMIN D   Take 1,000 Units by mouth daily.      furosemide 40 MG tablet   Commonly known as: LASIX   Take 0.5 tablets (20 mg total) by mouth daily.      haloperidol 5 MG tablet   Commonly known as: HALDOL   Take 1 tablet (5 mg total) by mouth at bedtime.      multivitamin with minerals Tabs   Take 1 tablet by mouth daily.      pyridOXINE 100 MG tablet   Commonly known as: VITAMIN B-6   Take 100 mg by mouth daily.      vitamin B-12 1000 MCG tablet   Commonly known as: CYANOCOBALAMIN   Take 1,000 mcg by mouth daily.      vitamin E 100 UNIT capsule   Take 100 Units by mouth daily.        Disposition and follow-up:   Mr.Todd Zavala was discharged from [facility] in stable condition.  At the hospital follow up visit please assess resolution of lower extremity edema (greater in left leg), address tapering off tegretol and assure that patient has followed up with [facility]. He will need a repeat BMET for serum potassium monitoring with continued Lasix therapy.   Follow-up Appointments:     Follow-up Information    Follow up with Lollie Sails, MD. (November 18th, Monday, at 10:15AM. Please  bring all your medications to this appointment)    Contact information:   [address] [place] [address] [phone]       Schedule an appointment as soon as possible for a visit with [facility]. (Follow up with them next week for your medication changes)    Contact information:   [address] [place] [address] [phone]        Discharge Orders    Future Appointments: Provider: Department: Dept Phone: Center:   02/21/2012 10:15 AM Lollie Sails, MD [facility] [phone] [facility]     Future Orders Please Complete By Expires   Diet - low sodium heart healthy      Increase activity slowly         Consultations: Dr. Ferol Luz, Psychiatry  Procedures Performed:  Dg Chest Port 1 View  01/25/2012  *RADIOLOGY REPORT*  Clinical Data: Leg swelling  PORTABLE CHEST - 1 VIEW  Comparison: 12/24/2011  Findings: Lungs are clear. No pleural effusion or pneumothorax. The cardiomediastinal contours are within normal limits. The visualized bones and soft tissues are without significant appreciable abnormality.  IMPRESSION: No radiographic evidence of acute cardiopulmonary  process.   Original Report Authenticated By: Waneta Martins, M.D.    Dg Foot Complete Right  01/20/2012  *RADIOLOGY REPORT*  Clinical Data: Pain and swelling  RIGHT FOOT COMPLETE - 3+ VIEW  Comparison: None.  Findings:  Frontal, oblique, lateral views were obtained.  No fracture or dislocation.  Joint spaces appear intact.  No erosive change.  On the lateral view, there is a the and linear opacity which may represent either cassette artifact or a possible foreign body in the volar aspect of the mid foot.  IMPRESSION: Question cassette artifact on the lateral view versus linear radiopaque foreign body in the volar mid foot region.  If there is any concern for radiopaque foreign body in this area, repeat image of the lateral right foot may be advisable.   Note that this finding is not seen on other views.  Elsewhere, there is no fracture.  No bony destruction.   Original Report Authenticated By: Arvin Collard. Margarita Grizzle III, M.D.     2D Echo: 02/09/12 Study Conclusions  - Left ventricle: The cavity size was normal. Systolic function was normal. The estimated ejection fraction was in the range of 55% to 60%. Wall motion was normal; there were no regional wall motion abnormalities. - Atrial septum: No defect or patent foramen ovale was Identified.  Lower Extremity Venous Duplex Bilateral 02/10/12 Summary:  - Re- evaluation post decrease in swelling of the lower extremities. - No evidence of deep vein or superficial thrombosis involving the right lower extremity and left lower extremity. - No evidence of Baker's cyst on the right or left.   Admission HPI:  Patient is a 56 y.o. male with a PMHx of bipolar disorder, homelessness, HTN, and s/p splenectomy who presents to [facility] for evaluation of 2 month history of progressively worsening asymmetric bilateral lower extremity edema extending from ankles to knees and for 1 month history of left lower leg pain and tightness. Pain gradually worsening, intermittent, sharp pain that is confined to the left lower leg from ankle to mid-calf, without radiation. Episodes last approximately a few minutes minutes at a time with 15 minutes. Currently, the pain is rated a 0/10 in severity. At its worst, the pain is rated a 9/10 in severity. Aggravated by walking around extensively, alleviated by nothing.  In regards to the lower extremity swelling, states that he has only had this problem for last 2 months, requiring multiple ER evaluations (last was in 01/25/2012). During the ER evaluation, doppler ultrasound was performed in 12/2011, and was negative for evidence of DVT. There was concern for possible CHF, therefore, the patient was started on lasix. He has been out of his lasix x 1 week and has noted that the lower  extremity swelling has been worse over this time frame. Denies recent insect bites, abrasions to, lacerations of, or ulcerations of his legs.  Denies chest pain, palpitations, shortness, difficulty breathing, scrotal edema, abdominal edema. Denies recent long distance travel including air or car rides. No renal or hepatic impairment of which the patient is aware. States that he is staying away from salty foods as able at his homeless shelter.   Hospital Course by problem list: 1) Bilateral lower extremity edema - Etiology is still unclear upon his discharge. Asymmetrical appearance of LE edema (with worse edema of the left leg) and elevated D-dimer certainly  raised concern for DVT (with Wells score for DVT indicating Low-to-intermediate probability in setting of asymmetric edema and localized pain). He was promptly started on full  dose Lovenox for empiric treatment of presumed left lower extremity DVT. However, his LE bilateral venous duplex, both initial and repeat study, were unremarkable for DVT and Lovenox full dose was discontinued after repeat Duplex.He additionally had risk factors for heart failure including untreated hypertension, ongoing tobacco abuse, former alcohol abuse - although no pulmonary symptoms or signs noted on exam. His 2D echo was unremarkable for CHF. There are rare reports of cardiomyopathy with Tegretol and, since Mr. Gerome Apley reported recently initiating this medication, there were concerns for myopathy. His CK/CKMB were elevated but trended down; his Troponin was negative with three serial values .He has no history of hepatic or renal dysfunction to cause volume overload. His UA had no proteinuria and his microalbumin/creatinine ratio was normal . Of note, recent TSH was normal in 01/2012. Although his echo was negative for CHF, his LE improved with Lasix, and ACE band wrapping and elevation of his LE. He was also initiated on a taper off regimen from Tegretol by Psychiatry. He will  be discharged with tapered dose of Tegretol, recommendation for follow up with [facility] for final discontinuation of this medication. He will continue taking Lasix (decreased dose of 20mg  BID) for the LE and is encouraged to maintain his legs elevated as much as possible for continued resolution of his LE edema. He will follow up with the [facility].   2) Bipolar disorder. He does not remember when he was diagnosed with bipolar disorder. He is followed at [facility]. We had requested records from [facility] regarding psychiatric medications adjustments and recent initiation but they had not been received at the time of Mr. Florentina Addison discharge. We continued his Haldol, and Tegretol, and resumed Abilify just prior to his discharge. We consulted Psychiatry for recommendation with Tegretol discontinuation. Per Dr. Ferol Luz, Psychiatrist, we initiated tapering off of Tegretol from 200mg  BID to 150mg  BID for two days, followed by 100mg  BID upon his discharge. He will follow up with [facility] for final discontinuation of this medication. Dr. Ferol Luz also recommended continuation of home dose Abilify and possible of initiation of Seroquel once Tegretol had been discontinued. Also per Dr. Blenda Peals there is a possibility of medication assistance from the psychiatric medication manufactures for Mr. Zenteno if he follows up with [facility]. During this hospitalization, Mr. Dani Gobble was given a one time, one hour pass. He met with Spiritual Care specialist at that time and he reported improvement of his overall health. On the day of his discharge he reported that he had already contacted [facility] and had an appointment with his psychiatrist on 02/17/12.   3) Hypokalemia - This was thought to be secondary to lasix administration and required oral potassium repletion. His potassium was normal on the day of his discharge.    4) History of hypertension - His BP was overall normal during this hospitalization but in the context of Lasix therapy. He will  need outpatient followed for this problem.   5) Hyperlipidemia, history of - He was not on outpatient treatment. Per ATP III guidelines, his LDL goal is < 130 mg/dL. His Lipid panel with LDL of 116, with no need for medication at this time.   6) Homelessness - He is currently homeless and has been coming to ED multiple times in the  last few months for medical care because he has been unable to afford his medications. He is currently living at the [facility]. He also has issue with transportation. On the day of his discharge, however, he reported having filled all his  medications recently except for Lasix. He will follow up at the [facility] for continued care upon his discharge. A [facility] social worker consult is indicated for evaluation of additional community resources that might be available to Mr. Hofland.   Discharge Vitals:  BP 140/78  Pulse 81  Temp 97.5 F (36.4 C) (Oral)  Resp 18  Ht 5\' 11"  (1.803 m)  Wt 168 lb 3.2 oz (76.295 kg)  BMI 23.46 kg/m2  SpO2 99%  Discharge Labs:  Results for orders placed during the hospital encounter of 02/08/12 (from the past 24 hour(s))  CK     Status: Abnormal   Collection Time   02/11/12  5:00 AM      Component Value Range   Total CK 360 (*) 7 - 232 U/L  CBC     Status: Normal   Collection Time   02/11/12  5:00 AM      Component Value Range   WBC 5.7  4.0 - 10.5 K/uL   RBC 4.93  4.22 - 5.81 MIL/uL   Hemoglobin 14.0  13.0 - 17.0 g/dL   HCT 16.1  09.6 - 04.5 %   MCV 84.4  78.0 - 100.0 fL   MCH 28.4  26.0 - 34.0 pg   MCHC 33.7  30.0 - 36.0 g/dL   RDW 40.9  81.1 - 91.4 %   Platelets 372  150 - 400 K/uL  COMPREHENSIVE METABOLIC PANEL     Status: Abnormal   Collection Time   02/11/12  5:00 AM      Component Value Range   Sodium 135  135 - 145 mEq/L   Potassium 4.0  3.5 - 5.1 mEq/L   Chloride 95 (*) 96 - 112 mEq/L   CO2 29  19 - 32 mEq/L   Glucose, Bld 93  70 - 99 mg/dL   BUN 14  6 - 23 mg/dL   Creatinine, Ser 7.82  0.50 - 1.35 mg/dL   Calcium  9.8  8.4 - 95.6 mg/dL   Total Protein 8.4 (*) 6.0 - 8.3 g/dL   Albumin 3.5  3.5 - 5.2 g/dL   AST 51 (*) 0 - 37 U/L   ALT 44  0 - 53 U/L   Alkaline Phosphatase 81  39 - 117 U/L   Total Bilirubin 0.2 (*) 0.3 - 1.2 mg/dL   GFR calc non Af Amer >90  >90 mL/min   GFR calc Af Amer >90  >90 mL/min    Signed: Sara Chu D 02/11/2012, 2:26 PM   Time Spent on Discharge: 40 minutes Services Ordered on Discharge: None Equipment Ordered on Discharge: None

## 2012-02-21 ENCOUNTER — Ambulatory Visit: Payer: Self-pay | Admitting: Internal Medicine

## 2012-07-14 ENCOUNTER — Encounter (HOSPITAL_COMMUNITY): Payer: Self-pay

## 2012-07-14 ENCOUNTER — Emergency Department (HOSPITAL_COMMUNITY)
Admission: EM | Admit: 2012-07-14 | Discharge: 2012-07-14 | Disposition: A | Discharge status: Home / Self Care | Payer: Self-pay | Attending: Emergency Medicine | Admitting: Emergency Medicine

## 2012-07-14 DIAGNOSIS — Z79899 Other long term (current) drug therapy: Secondary | ICD-10-CM | POA: Insufficient documentation

## 2012-07-14 DIAGNOSIS — F319 Bipolar disorder, unspecified: Secondary | ICD-10-CM | POA: Insufficient documentation

## 2012-07-14 DIAGNOSIS — Z8639 Personal history of other endocrine, nutritional and metabolic disease: Secondary | ICD-10-CM | POA: Insufficient documentation

## 2012-07-14 DIAGNOSIS — Z862 Personal history of diseases of the blood and blood-forming organs and certain disorders involving the immune mechanism: Secondary | ICD-10-CM | POA: Insufficient documentation

## 2012-07-14 DIAGNOSIS — F17200 Nicotine dependence, unspecified, uncomplicated: Secondary | ICD-10-CM | POA: Insufficient documentation

## 2012-07-14 DIAGNOSIS — M549 Dorsalgia, unspecified: Secondary | ICD-10-CM | POA: Insufficient documentation

## 2012-07-14 DIAGNOSIS — M542 Cervicalgia: Secondary | ICD-10-CM | POA: Insufficient documentation

## 2012-07-14 DIAGNOSIS — I10 Essential (primary) hypertension: Secondary | ICD-10-CM | POA: Insufficient documentation

## 2012-07-14 MED ORDER — METHOCARBAMOL 500 MG PO TABS: 500.0000 mg | ORAL_TABLET | Freq: Two times a day (BID) | ORAL | Status: DC

## 2012-07-14 MED ORDER — HYDROCODONE-ACETAMINOPHEN 5-325 MG PO TABS: 1.0000 | ORAL_TABLET | Freq: Four times a day (QID) | ORAL | Status: DC | PRN

## 2012-07-14 NOTE — ED Notes (Signed)
C/o neck and shoulder pain since Dec 2013. Pt states "I am a charity case.Marland KitchenMarland KitchenMarland KitchenI am trying to qualify for charity".
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
## 2012-07-14 NOTE — ED Notes (Signed)
Pt posterior neck and upper back pain since 03/20/2012, pt reports the pain radiates into bilateral shoulders w/movement. Pt states he was lifting weights trying to keep up with "the youngsters' in December when he felt his neck pull. Pt presents w/his neck bent, states he is unable to fully straighten his neck, pt denies headache. Pt states he is taking OTC Tylenol w/no relief and is requesting a prescribed pain medication for the pain.

## 2012-07-23 NOTE — ED Provider Notes (Signed)
Medical screening examination/treatment/procedure(s) were performed by non-physician practitioner and as supervising physician I was immediately available for consultation/collaboration.  Flint Melter, MD 07/23/12 2111

## 2013-02-13 ENCOUNTER — Emergency Department (HOSPITAL_COMMUNITY)
Admission: EM | Admit: 2013-02-13 | Discharge: 2013-02-13 | Disposition: A | Discharge status: Home / Self Care | Payer: 59 | Attending: Emergency Medicine | Admitting: Emergency Medicine

## 2013-02-13 ENCOUNTER — Emergency Department (HOSPITAL_COMMUNITY): Payer: 59

## 2013-02-13 ENCOUNTER — Encounter (HOSPITAL_COMMUNITY): Payer: Self-pay | Admitting: Emergency Medicine

## 2013-02-13 DIAGNOSIS — Z9889 Other specified postprocedural states: Secondary | ICD-10-CM | POA: Insufficient documentation

## 2013-02-13 DIAGNOSIS — Z791 Long term (current) use of non-steroidal anti-inflammatories (NSAID): Secondary | ICD-10-CM | POA: Insufficient documentation

## 2013-02-13 DIAGNOSIS — R109 Unspecified abdominal pain: Secondary | ICD-10-CM

## 2013-02-13 DIAGNOSIS — I10 Essential (primary) hypertension: Secondary | ICD-10-CM | POA: Insufficient documentation

## 2013-02-13 DIAGNOSIS — R1013 Epigastric pain: Secondary | ICD-10-CM | POA: Insufficient documentation

## 2013-02-13 DIAGNOSIS — F17200 Nicotine dependence, unspecified, uncomplicated: Secondary | ICD-10-CM | POA: Insufficient documentation

## 2013-02-13 DIAGNOSIS — E785 Hyperlipidemia, unspecified: Secondary | ICD-10-CM | POA: Insufficient documentation

## 2013-02-13 DIAGNOSIS — R634 Abnormal weight loss: Secondary | ICD-10-CM | POA: Insufficient documentation

## 2013-02-13 DIAGNOSIS — F319 Bipolar disorder, unspecified: Secondary | ICD-10-CM | POA: Insufficient documentation

## 2013-02-13 DIAGNOSIS — Z79899 Other long term (current) drug therapy: Secondary | ICD-10-CM | POA: Insufficient documentation

## 2013-02-13 LAB — COMPREHENSIVE METABOLIC PANEL
Albumin: 3.7 g/dL (ref 3.5–5.2)
Alkaline Phosphatase: 58 U/L (ref 39–117)
BUN: 11 mg/dL (ref 6–23)
Creatinine, Ser: 0.7 mg/dL (ref 0.50–1.35)
GFR calc Af Amer: >90 mL/min (ref >90)
Glucose, Bld: 97 mg/dL (ref 70–99)
Total Protein: 7.6 g/dL (ref 6.0–8.3)

## 2013-02-13 LAB — CBC WITH DIFFERENTIAL/PLATELET
Basophils Absolute: 0 10*3/uL (ref 0.0–0.1)
Eosinophils Relative: 0 % (ref 0–5)
HCT: 41.4 % (ref 39.0–52.0)
Lymphocytes Relative: 14 % (ref 12–46)
Lymphs Abs: 0.8 10*3/uL (ref 0.7–4.0)
MCV: 86.8 fL (ref 78.0–100.0)
Monocytes Absolute: 0.2 10*3/uL (ref 0.1–1.0)
Neutro Abs: 4.5 10*3/uL (ref 1.7–7.7)
Platelets: 188 10*3/uL (ref 150–400)
RBC: 4.77 MIL/uL (ref 4.22–5.81)
RDW: 13.2 % (ref 11.5–15.5)
WBC: 5.5 10*3/uL (ref 4.0–10.5)

## 2013-02-13 MED ORDER — IOHEXOL 300 MG/ML  SOLN
100.0000 mL | Freq: Once | INTRAMUSCULAR | Status: AC | PRN
  Administered 2013-02-13: 100 mL via INTRAVENOUS

## 2013-02-13 MED ORDER — IOHEXOL 300 MG/ML  SOLN
25.0000 mL | INTRAMUSCULAR | Status: AC
  Administered 2013-02-13: 25 mL via ORAL

## 2013-02-13 NOTE — ED Notes (Signed)
Pt arrives to ed c/o abd pain 4/10 onset 4 weeks ago.  Pt sts lost 30-40 lbs over the 4 week period.  Pt sts eats 2x day.  Pt denies recent illness/injury, changes in meds.  Pt denies n/v.  Last BM 2 weeks ago.  Pt caox4, pmsx4, nad.
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
## 2014-07-29 ENCOUNTER — Ambulatory Visit (HOSPITAL_COMMUNITY)
Admission: RE | Admit: 2014-07-29 | Discharge: 2014-07-29 | Disposition: A | Discharge status: Home / Self Care | Payer: Federal, State, Local not specified - Other | Attending: Psychiatry | Admitting: Psychiatry
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
## 2014-07-31 NOTE — [facility] Counselor (Signed)
Writer exchanged [facility]. Her voicemail states that Dr Marylene Buerger'Neal's first appointment isn't until June 10th, and Todd Zavala can call Todd BlonderDenise directly at [phone]. Writer then called and spoke w/ Todd Zavala. Writer relayed [name]'s msg to Todd Zavala. Todd Zavala reports Todd Zavala will call to schedule a June 10th appt. Writer also encouraged [facility] to [facility] in the interim if Todd Zavala feels Todd Zavala needs to start psych meds prior to June 10th.  Todd Cristalaroline Paige Shahara [place], [place] Therapeutic Triage Specialist

## 2018-05-23 DIAGNOSIS — E782 Mixed hyperlipidemia: Secondary | ICD-10-CM | POA: Diagnosis not present

## 2018-05-23 DIAGNOSIS — Q829 Congenital malformation of skin, unspecified: Secondary | ICD-10-CM | POA: Diagnosis not present

## 2018-05-23 DIAGNOSIS — Z01118 Encounter for examination of ears and hearing with other abnormal findings: Secondary | ICD-10-CM | POA: Diagnosis not present

## 2018-05-23 DIAGNOSIS — Z136 Encounter for screening for cardiovascular disorders: Secondary | ICD-10-CM | POA: Diagnosis not present

## 2018-05-23 DIAGNOSIS — I10 Essential (primary) hypertension: Secondary | ICD-10-CM | POA: Diagnosis not present

## 2018-05-23 DIAGNOSIS — Z5181 Encounter for therapeutic drug level monitoring: Secondary | ICD-10-CM | POA: Diagnosis not present

## 2018-05-23 DIAGNOSIS — Z113 Encounter for screening for infections with a predominantly sexual mode of transmission: Secondary | ICD-10-CM | POA: Diagnosis not present

## 2018-05-23 DIAGNOSIS — R7303 Prediabetes: Secondary | ICD-10-CM | POA: Diagnosis not present

## 2018-05-23 DIAGNOSIS — R5383 Other fatigue: Secondary | ICD-10-CM | POA: Diagnosis not present

## 2018-05-23 DIAGNOSIS — Z131 Encounter for screening for diabetes mellitus: Secondary | ICD-10-CM | POA: Diagnosis not present

## 2018-05-23 DIAGNOSIS — H539 Unspecified visual disturbance: Secondary | ICD-10-CM | POA: Diagnosis not present

## 2018-05-23 DIAGNOSIS — F319 Bipolar disorder, unspecified: Secondary | ICD-10-CM | POA: Diagnosis not present

## 2018-05-23 DIAGNOSIS — Z1329 Encounter for screening for other suspected endocrine disorder: Secondary | ICD-10-CM | POA: Diagnosis not present

## 2019-04-16 DIAGNOSIS — H539 Unspecified visual disturbance: Secondary | ICD-10-CM | POA: Diagnosis not present

## 2019-04-16 DIAGNOSIS — E782 Mixed hyperlipidemia: Secondary | ICD-10-CM | POA: Diagnosis not present

## 2019-04-16 DIAGNOSIS — Z1329 Encounter for screening for other suspected endocrine disorder: Secondary | ICD-10-CM | POA: Diagnosis not present

## 2019-04-16 DIAGNOSIS — Z125 Encounter for screening for malignant neoplasm of prostate: Secondary | ICD-10-CM | POA: Diagnosis not present

## 2019-04-16 DIAGNOSIS — I10 Essential (primary) hypertension: Secondary | ICD-10-CM | POA: Diagnosis not present

## 2019-04-16 DIAGNOSIS — Z136 Encounter for screening for cardiovascular disorders: Secondary | ICD-10-CM | POA: Diagnosis not present

## 2019-04-16 DIAGNOSIS — Z131 Encounter for screening for diabetes mellitus: Secondary | ICD-10-CM | POA: Diagnosis not present

## 2019-04-16 DIAGNOSIS — R7303 Prediabetes: Secondary | ICD-10-CM | POA: Diagnosis not present

## 2019-04-16 DIAGNOSIS — Z01118 Encounter for examination of ears and hearing with other abnormal findings: Secondary | ICD-10-CM | POA: Diagnosis not present

## 2019-10-15 DIAGNOSIS — R7303 Prediabetes: Secondary | ICD-10-CM | POA: Diagnosis not present

## 2019-10-15 DIAGNOSIS — I10 Essential (primary) hypertension: Secondary | ICD-10-CM | POA: Diagnosis not present

## 2019-10-15 DIAGNOSIS — E782 Mixed hyperlipidemia: Secondary | ICD-10-CM | POA: Diagnosis not present

## 2020-01-22 DIAGNOSIS — Z23 Encounter for immunization: Secondary | ICD-10-CM | POA: Diagnosis not present

## 2020-01-22 DIAGNOSIS — R7303 Prediabetes: Secondary | ICD-10-CM | POA: Diagnosis not present

## 2020-01-22 DIAGNOSIS — I10 Essential (primary) hypertension: Secondary | ICD-10-CM | POA: Diagnosis not present

## 2020-01-22 DIAGNOSIS — Z131 Encounter for screening for diabetes mellitus: Secondary | ICD-10-CM | POA: Diagnosis not present

## 2020-01-22 DIAGNOSIS — Z1329 Encounter for screening for other suspected endocrine disorder: Secondary | ICD-10-CM | POA: Diagnosis not present

## 2020-01-22 DIAGNOSIS — E782 Mixed hyperlipidemia: Secondary | ICD-10-CM | POA: Diagnosis not present

## 2020-01-25 ENCOUNTER — Ambulatory Visit (INDEPENDENT_AMBULATORY_CARE_PROVIDER_SITE_OTHER): Payer: Medicare Other | Admitting: Clinical

## 2020-01-25 ENCOUNTER — Other Ambulatory Visit: Payer: Self-pay

## 2020-01-25 DIAGNOSIS — F3181 Bipolar II disorder: Secondary | ICD-10-CM | POA: Diagnosis not present

## 2020-01-26 ENCOUNTER — Ambulatory Visit (HOSPITAL_COMMUNITY)
Admission: EM | Admit: 2020-01-26 | Discharge: 2020-01-26 | Disposition: A | Discharge status: Home / Self Care | Payer: Medicare Other | Attending: Behavioral Health | Admitting: Behavioral Health

## 2020-01-26 ENCOUNTER — Other Ambulatory Visit: Payer: Self-pay

## 2020-01-26 ENCOUNTER — Encounter (HOSPITAL_COMMUNITY): Payer: Self-pay

## 2020-01-26 DIAGNOSIS — F3181 Bipolar II disorder: Secondary | ICD-10-CM | POA: Insufficient documentation

## 2020-01-26 DIAGNOSIS — F419 Anxiety disorder, unspecified: Secondary | ICD-10-CM | POA: Insufficient documentation

## 2020-01-26 DIAGNOSIS — R4587 Impulsiveness: Secondary | ICD-10-CM | POA: Insufficient documentation

## 2020-01-26 DIAGNOSIS — G4700 Insomnia, unspecified: Secondary | ICD-10-CM | POA: Insufficient documentation

## 2020-01-26 DIAGNOSIS — R450 Nervousness: Secondary | ICD-10-CM | POA: Insufficient documentation

## 2020-01-26 NOTE — BH Assessment (Signed)
Comprehensive Clinical Assessment (CCA) Screening, Triage and Referral Note    01/26/2020 Todd Zavala [id number]   Todd Zavala is a 64 year old male who presents to the [facility] voluntarily accompanied by his son for lack of sleep. The patient son disclosed that his dad is not sleeping much at night. He disclosed that his dad is getting 2-3 hours of sleep.Pt currently denies SI, HI, AVH and SIB. Pt states that he was taking medications for bipolar disorder more than 5 years ago and feels he needs them now. Per pt chart pt was assessed on 01/25/20 at [facility] by a social worker,The clients' son reported the client has a history of treatment for bipolar disorder. Client reported he was diagnosed in 2014. Son reported the client was previously receiving evaluation and medication management with another psychiatrist in [place] but unable to recall the practice name.  The client's son reported he recently found out his father, the client, has not taken his medication in five years for his mental health and other medical condition of congestive heart failure. The client reported he just decided to stop taking his medication. The son reported the client's psychotropic medication included Tegretol, Risperdal, and Trazadone. The son stated, "he is going through manic episode currently, been off medication for 5 years. He stayed inside for 5 years and chose not to go outside". Client agreed to that report. Son also reported the client is currently displaying "he's hyper, insomnia 2 hours of sleep per day, mind is racing, bizarre behavior when he gets an idea, he gets it off and expect others to go along with it". Client agreed with that report also. Son and client reported no history of hospitalization for the client's symptoms. During assessment pt was cooperative, presented with a bit of tangential speech but pleasant. The patient has a follow up appointment at [facility] on November 3rd and will meet with  provider.      Diagnosis:  Bipolar 2 Disposition: Elenore Paddy, FNP, recommends pt is psych cleared. Pt to follow up at [facility] in November 3rd.      Visit Diagnosis:    ICD-10-CM   1. Bipolar 2 disorder (HCC)  F31.81     Patient Reported Information How did you hear about [place]? Self (Son)   Referral name: Son/ [name] (Son/ [name])   Referral phone number: [phone]  Whom do you see for routine medical problems? I don't have a doctor   Practice/Facility Name: No data recorded  Practice/Facility Phone Number: No data recorded  Name of Contact: No data recorded  Contact Number: No data recorded  Contact Fax Number: No data recorded  Prescriber Name: No data recorded  Prescriber Address (if known): No data recorded What Is the Reason for Your Visit/Call Today? No data recorded How Long Has This Been Causing You Problems? 1-6 months  Have You Recently Been in Any Inpatient Treatment (Hospital/Detox/Crisis Center/28-Day Program)? No   Name/Location of Program/Hospital:No data recorded  How Long Were You There? No data recorded  When Were You Discharged? No data recorded Have You Ever Received Services From [facility] Before? No   Who Do You See at [facility]? No data recorded Have You Recently Had Any Thoughts About Hurting Yourself? No   Are You Planning to Commit Suicide/Harm Yourself At This time?  No  Have you Recently Had Thoughts About Hurting Someone [name]? No   Explanation: No data recorded Have You Used Any Alcohol or Drugs in the Past 24 Hours?  No   How Long Ago Did You Use Drugs or Alcohol?  No data recorded  What Did You Use and How Much? No data recorded What Do You Feel Would Help You the Most Today? Assessment Only  Do You Currently Have a Therapist/Psychiatrist? No   Name of Therapist/Psychiatrist: No data recorded  Have You Been Recently Discharged From Any Office Practice or Programs? No   Explanation of Discharge From  Practice/Program:  No data recorded    CCA Screening Triage Referral Assessment Type of Contact: Face-to-Face   Is this Initial or Reassessment? No data recorded  Date Telepsych consult ordered in CH[facility]:  No data recorded  Time Telepsych consult ordered in [facility]:  No data recorded Patient Reported Information Reviewed? Yes   Patient Left Without Being Seen? No data recorded  Reason for Not Completing Assessment: No data recorded Collateral Involvement: Son/ [name] (Son/ [employer])  Does Patient Have a Automotive engineer Guardian? No data recorded  Name and Contact of Legal Guardian:  No data recorded If Minor and Not Living with Parent(s), Who has Custody? No data recorded Is CPS involved or ever been involved? Never  Is APS involved or ever been involved? Never  Patient Determined To Be At Risk for Harm To Self or Others Based on Review of Patient Reported Information or Presenting Complaint? No   Method: No data recorded  Availability of Means: No data recorded  Intent: No data recorded  Notification Required: No data recorded  Additional Information for Danger to Others Potential:  No data recorded  Additional Comments for Danger to Others Potential:  No data recorded  Are There Guns or Other Weapons in Your Home?  No data recorded   Types of Guns/Weapons: No data recorded   Are These Weapons Safely Secured?                              No data recorded   Who Could Verify You Are Able To Have These Secured:    No data recorded Do You Have any Outstanding Charges, Pending Court Dates, Parole/Probation? No data recorded Contacted To Inform of Risk of Harm To Self or Others: No data recorded Location of Assessment: [facility]  Does Patient Present under Involuntary Commitment? No   IVC Papers Initial File Date: No data recorded  [place] of Residence: [place]  Patient Currently Receiving the Following Services: Not Receiving  Services   Determination of Need: Emergent (2 hours)   Options For Referral: Outpatient at [facility], LCSWA
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
## 2020-01-26 NOTE — ED Triage Notes (Signed)
Pt arrives with son with whom he lives with complaints of anxiety and poor sleep. Per son pt has diagnosis of bipolar and has been off meds for a long time. Pt & son report less than 1 hour sleep per night for 6 weeks. Report feeling 'on' all the time. Calm & cooperative, ambulating independently.

## 2020-01-26 NOTE — ED Provider Notes (Signed)
Behavioral Health Urgent Care Medical Screening Exam  Patient Name: Todd Zavala MRN: [id number] Date of Evaluation: 01/26/20 Chief Complaint:   Diagnosis:  Final diagnoses:  Bipolar 2 disorder ([place])    History of Present illness: Todd Zavala is a 64 y.o. male. The patient was seen face-to-face by this provider; chart reviewed, initial discussion with his son as to why he was brought to [facility] on 01/26/2020. The patient son disclosed that his dad is not sleeping much at night. He revealed that his dad is getting 2-3 hours of sleep. The patient son provides care to him. The patient is alert and oriented x3, anxious but cooperative, and mood-congruent with affect.  The patient met with an SW on 01/25/2020 and had an appointment with the outpatient provider on 02/06/20. The patient and his son were encouraged to keep that appointment. It was discussed with the patient and his son that it is not clinically safe to restart his dad on some medications that he has been off from for five years. Since his dad was last on those medications, it will be safer for the patient to see his outpatient psychiatric provider, who will get him on the proper medications that will work due to his age and the medical diagnosis he is battling. The patient reported he was diagnosed in 2014 with Bipolar 2. Son said his dad was previously receiving evaluation and medication management with another psychiatrist in [place]. The son voiced, "I cannot remember his name."  The patient's son reported he recently found out his father has not been taken his medication in five years for his mental health and other medical condition of congestive heart failure. The patient reported he had just decided to stop taking his medication. The patient's son said that his dad was on psychotropic medication included Tegretol, Risperdal, and Trazadone. The son stated, "he is going through manic episode currently, been off medication for five  years. The patient does not appear to be responding to internal or external stimuli. Neither is the patient presenting with any delusional thinking. The patient presented with some manic symptoms. He is extremely talkative, loud, and at times, with his son, he is intrusive. The patient denies auditory or visual hallucinations. The patient denies any suicidal, homicidal, or self-harm ideations. The patient is not presenting with any psychotic or paranoid behaviors. During an encounter with the patient, he was able to answer questions appropriately. Plan: The patient is not a safety risk to himself or others and does not require psychiatric inpatient admission for stabilization and treatment.  Psychiatric Specialty Exam  Presentation  General Appearance:Appropriate for Environment  Eye Contact:Good  Speech:Clear and Coherent  Speech Volume:Increased  Handedness:Right   Mood and Affect  Mood:Anxious  Affect:Appropriate;Congruent   Thought Process  Thought Processes:Coherent  Descriptions of Associations:Intact  Orientation:Full (Time, Place and Person)  Thought Content:Logical;Rumination  Hallucinations:None  Ideas of Reference:None  Suicidal Thoughts:No  Homicidal Thoughts:No   Sensorium  Memory:Immediate Good;Recent Good;Remote Good  Judgment:Fair  Insight:Fair   Executive Functions  Concentration:Fair  Attention Span:Fair  [place]   Psychomotor Activity  Psychomotor Activity:Normal   Assets  Assets:Communication Skills;Desire for Improvement;Physical Health;Social Support   Sleep  Sleep:Poor  Number of hours: -1   Physical Exam: Physical Exam Vitals and nursing note reviewed. Exam conducted with a chaperone present.  Constitutional:      Appearance: Normal appearance. He is normal weight.  HENT:     Right Ear: Tympanic membrane normal.  Left Ear: Tympanic membrane normal.     Nose: Nose  normal.  Pulmonary:     Effort: Pulmonary effort is normal.  Musculoskeletal:        General: Normal range of motion.     Cervical back: Normal range of motion and neck supple.  Neurological:     General: No focal deficit present.     Mental Status: He is alert and oriented to person, place, and time.  Psychiatric:        Attention and Perception: Attention and perception normal.        Mood and Affect: Affect normal. Mood is anxious and elated.        Speech: Speech is rapid and pressured and tangential.        Behavior: Behavior is hyperactive. Behavior is cooperative.        Cognition and Memory: Cognition and memory normal.        Judgment: Judgment is impulsive.    Review of Systems  Psychiatric/Behavioral: The patient is nervous/anxious and has insomnia.   All other systems reviewed and are negative.  Blood pressure (!) 172/83, pulse 100, temperature 98 F (36.7 C), temperature source Temporal, resp. rate 20, SpO2 98 %. There is no height or weight on file to calculate BMI.  Musculoskeletal: Strength & Muscle Tone: within normal limits Gait & Station: normal Patient leans: N/A   [facility] Discharge Disposition for Follow up and Recommendations: Based on my evaluation the patient does not appear to have an emergency medical condition and can be discharged with resources and follow up care in outpatient services for F/U outpatient provider.   Caroline Sauger, NP 01/26/2020, 9:26 PM

## 2020-01-26 NOTE — ED Notes (Signed)
AVS reviewed with patient & son, Pt able to repeat discharge instructions from nurse practioner. All questions answered & pt/son reminded they can call or return any time if needed. Walked to lobby, per son wife had all belongings in lobby. Pt continues alert & oriented and calm & cooperative, denying SI/HI/AVH.

## 2020-01-27 ENCOUNTER — Emergency Department (HOSPITAL_COMMUNITY)
Admission: EM | Admit: 2020-01-27 | Discharge: 2020-01-27 | Disposition: A | Discharge status: Home / Self Care | Payer: Medicare Other | Attending: Emergency Medicine | Admitting: Emergency Medicine

## 2020-01-27 ENCOUNTER — Emergency Department (HOSPITAL_COMMUNITY): Payer: Medicare Other

## 2020-01-27 DIAGNOSIS — Z87891 Personal history of nicotine dependence: Secondary | ICD-10-CM | POA: Insufficient documentation

## 2020-01-27 DIAGNOSIS — M7989 Other specified soft tissue disorders: Secondary | ICD-10-CM | POA: Diagnosis not present

## 2020-01-27 DIAGNOSIS — I110 Hypertensive heart disease with heart failure: Secondary | ICD-10-CM | POA: Insufficient documentation

## 2020-01-27 DIAGNOSIS — Z5900 Homelessness unspecified: Secondary | ICD-10-CM | POA: Insufficient documentation

## 2020-01-27 DIAGNOSIS — R0602 Shortness of breath: Secondary | ICD-10-CM | POA: Diagnosis not present

## 2020-01-27 DIAGNOSIS — I509 Heart failure, unspecified: Secondary | ICD-10-CM | POA: Diagnosis not present

## 2020-01-27 LAB — CBC
HCT: 43.6 % (ref 39.0–52.0)
Hemoglobin: 13.6 g/dL (ref 13.0–17.0)
MCH: 27.6 pg (ref 26.0–34.0)
MCHC: 31.2 g/dL (ref 30.0–36.0)
MCV: 88.6 fL (ref 80.0–100.0)
Platelets: 261 10*3/uL (ref 150–400)
RBC: 4.92 MIL/uL (ref 4.22–5.81)
RDW: 13.4 % (ref 11.5–15.5)
WBC: 6.1 10*3/uL (ref 4.0–10.5)
nRBC: 0 % (ref 0.0–0.2)

## 2020-01-27 LAB — BASIC METABOLIC PANEL
Anion gap: 10 (ref 5–15)
BUN: 6 mg/dL — ABNORMAL LOW (ref 8–23)
CO2: 25 mmol/L (ref 22–32)
Calcium: 9 mg/dL (ref 8.9–10.3)
Chloride: 103 mmol/L (ref 98–111)
Creatinine, Ser: 1.2 mg/dL (ref 0.61–1.24)
GFR, Estimated: >60 mL/min (ref >60)
Glucose, Bld: 105 mg/dL — ABNORMAL HIGH (ref 70–99)
Potassium: 3.7 mmol/L (ref 3.5–5.1)
Sodium: 138 mmol/L (ref 135–145)

## 2020-01-27 MED ORDER — ENOXAPARIN SODIUM 80 MG/0.8ML ~~LOC~~ SOLN
75.0000 mg | Freq: Once | SUBCUTANEOUS | Status: AC
  Administered 2020-01-27: 75 mg via SUBCUTANEOUS
  Filled 2020-01-27: qty 0.75

## 2020-01-27 NOTE — ED Notes (Signed)
Pt is d/c by MD and is given d/c instructions and follow up care, Pt is out of the ED ambulatory with family  

## 2020-01-27 NOTE — ED Triage Notes (Signed)
Pt here via pov with reports of increased BLE over the last few days. Endorses hx of CHF, denies currently taking diuretic.
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
## 2020-01-27 NOTE — Discharge Instructions (Addendum)
Return tomorrow to have your Doppler study

## 2020-01-28 ENCOUNTER — Other Ambulatory Visit: Payer: Self-pay

## 2020-01-28 ENCOUNTER — Ambulatory Visit (HOSPITAL_BASED_OUTPATIENT_CLINIC_OR_DEPARTMENT_OTHER)
Admission: RE | Admit: 2020-01-28 | Discharge: 2020-01-28 | Disposition: A | Discharge status: Home / Self Care | Payer: Medicare Other | Source: Ambulatory Visit | Attending: Emergency Medicine | Admitting: Emergency Medicine

## 2020-01-28 ENCOUNTER — Encounter (HOSPITAL_COMMUNITY): Payer: Self-pay | Admitting: Registered Nurse

## 2020-01-28 ENCOUNTER — Ambulatory Visit (HOSPITAL_COMMUNITY)
Admission: EM | Admit: 2020-01-28 | Discharge: 2020-01-29 | Disposition: A | Discharge status: Home / Self Care | Payer: Medicare Other | Attending: Registered Nurse | Admitting: Registered Nurse

## 2020-01-28 DIAGNOSIS — R609 Edema, unspecified: Secondary | ICD-10-CM | POA: Insufficient documentation

## 2020-01-28 DIAGNOSIS — Z20822 Contact with and (suspected) exposure to covid-19: Secondary | ICD-10-CM | POA: Insufficient documentation

## 2020-01-28 DIAGNOSIS — R4587 Impulsiveness: Secondary | ICD-10-CM | POA: Insufficient documentation

## 2020-01-28 DIAGNOSIS — Z87891 Personal history of nicotine dependence: Secondary | ICD-10-CM | POA: Insufficient documentation

## 2020-01-28 DIAGNOSIS — Z7289 Other problems related to lifestyle: Secondary | ICD-10-CM | POA: Insufficient documentation

## 2020-01-28 DIAGNOSIS — F3181 Bipolar II disorder: Secondary | ICD-10-CM | POA: Diagnosis present

## 2020-01-28 DIAGNOSIS — Z79899 Other long term (current) drug therapy: Secondary | ICD-10-CM | POA: Diagnosis not present

## 2020-01-28 DIAGNOSIS — F419 Anxiety disorder, unspecified: Secondary | ICD-10-CM | POA: Insufficient documentation

## 2020-01-28 DIAGNOSIS — R45851 Suicidal ideations: Secondary | ICD-10-CM | POA: Diagnosis not present

## 2020-01-28 LAB — POCT URINALYSIS DIP (DEVICE)
Bilirubin Urine: NEGATIVE
Bilirubin Urine: NEGATIVE
Glucose, UA: NEGATIVE mg/dL
Glucose, UA: NEGATIVE mg/dL
Hgb urine dipstick: NEGATIVE
Hgb urine dipstick: NEGATIVE
Ketones, ur: NEGATIVE mg/dL
Ketones, ur: NEGATIVE mg/dL
Leukocytes,Ua: NEGATIVE
Leukocytes,Ua: NEGATIVE
Nitrite: NEGATIVE
Nitrite: NEGATIVE
Protein, ur: NEGATIVE mg/dL
Protein, ur: NEGATIVE mg/dL
Specific Gravity, Urine: 1.025 (ref 1.005–1.030)
Specific Gravity, Urine: 1.025 (ref 1.005–1.030)
Urobilinogen, UA: 0.2 mg/dL (ref 0.0–1.0)
Urobilinogen, UA: 0.2 mg/dL (ref 0.0–1.0)
pH: 7 (ref 5.0–8.0)
pH: 6.5 (ref 5.0–8.0)

## 2020-01-28 LAB — LIPID PANEL
Cholesterol: 185 mg/dL (ref 0–200)
HDL: 57 mg/dL (ref >40)
LDL Cholesterol: 89 mg/dL (ref 0–99)
Total CHOL/HDL Ratio: 3.2 RATIO
Triglycerides: 194 mg/dL — ABNORMAL HIGH (ref <150)
VLDL: 39 mg/dL (ref 0–40)

## 2020-01-28 LAB — COMPREHENSIVE METABOLIC PANEL
ALT: 34 U/L (ref 0–44)
AST: 41 U/L (ref 15–41)
Albumin: 3.2 g/dL — ABNORMAL LOW (ref 3.5–5.0)
Alkaline Phosphatase: 62 U/L (ref 38–126)
Anion gap: 8 (ref 5–15)
BUN: 6 mg/dL — ABNORMAL LOW (ref 8–23)
CO2: 27 mmol/L (ref 22–32)
Calcium: 9.1 mg/dL (ref 8.9–10.3)
Chloride: 104 mmol/L (ref 98–111)
Creatinine, Ser: 1.12 mg/dL (ref 0.61–1.24)
GFR, Estimated: >60 mL/min (ref >60)
Glucose, Bld: 105 mg/dL — ABNORMAL HIGH (ref 70–99)
Potassium: 3.5 mmol/L (ref 3.5–5.1)
Sodium: 139 mmol/L (ref 135–145)
Total Bilirubin: 0.6 mg/dL (ref 0.3–1.2)
Total Protein: 6.8 g/dL (ref 6.5–8.1)

## 2020-01-28 LAB — POCT URINE DRUG SCREEN - MANUAL ENTRY (I-SCREEN)
POC Amphetamine UR: NOT DETECTED
POC Buprenorphine (BUP): NOT DETECTED
POC Cocaine UR: NOT DETECTED
POC Marijuana UR: NOT DETECTED
POC Methadone UR: NOT DETECTED
POC Methamphetamine UR: NOT DETECTED
POC Morphine: NOT DETECTED
POC Oxazepam (BZO): NOT DETECTED
POC Oxycodone UR: NOT DETECTED
POC Secobarbital (BAR): NOT DETECTED

## 2020-01-28 LAB — RESPIRATORY PANEL BY RT PCR (FLU A&B, COVID)
Influenza A by PCR: NEGATIVE
Influenza B by PCR: NEGATIVE
SARS Coronavirus 2 by RT PCR: NEGATIVE

## 2020-01-28 LAB — MAGNESIUM: Magnesium: 1.8 mg/dL (ref 1.7–2.4)

## 2020-01-28 LAB — TSH: TSH: 2.831 u[IU]/mL (ref 0.350–4.500)

## 2020-01-28 MED ORDER — HYDROXYZINE HCL 10 MG PO TABS
10.0000 mg | ORAL_TABLET | Freq: Three times a day (TID) | ORAL | Status: DC | PRN
  Filled 2020-01-28: qty 1

## 2020-01-28 MED ORDER — TRAZODONE HCL 50 MG PO TABS
50.0000 mg | ORAL_TABLET | Freq: Every evening | ORAL | Status: DC | PRN
  Administered 2020-01-29: 50 mg via ORAL
  Filled 2020-01-28: qty 1

## 2020-01-28 MED ORDER — ARIPIPRAZOLE 5 MG PO TABS
5.0000 mg | ORAL_TABLET | Freq: Once | ORAL | Status: AC
  Administered 2020-01-28: 5 mg via ORAL
  Filled 2020-01-28: qty 1

## 2020-01-28 MED ORDER — ACETAMINOPHEN 325 MG PO TABS: 650.0000 mg | ORAL_TABLET | Freq: Four times a day (QID) | ORAL | Status: DC | PRN
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
## 2020-01-28 NOTE — Discharge Instructions (Addendum)

## 2020-01-28 NOTE — Progress Notes (Signed)
Right lower extremity venous duplex has been completed. Preliminary results can be found in CV Proc through chart review.   01/28/20 12:07 PM Olen Cordial RVT

## 2020-01-28 NOTE — ED Triage Notes (Signed)
Pt arrives with son. Complaints of 'not wanting to be here anymore'. Pt states has been feeling this way for a while. Pt states has a plan but will not disclose. Pt alert & oriented, questions why he's being asked questions multiple times. Calm & cooperative.
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
## 2020-01-28 NOTE — ED Notes (Signed)
Items given to son.

## 2020-01-29 DIAGNOSIS — F3181 Bipolar II disorder: Secondary | ICD-10-CM | POA: Diagnosis not present

## 2020-01-29 DIAGNOSIS — Z20822 Contact with and (suspected) exposure to covid-19: Secondary | ICD-10-CM | POA: Diagnosis not present

## 2020-01-29 DIAGNOSIS — F419 Anxiety disorder, unspecified: Secondary | ICD-10-CM | POA: Diagnosis not present

## 2020-01-29 DIAGNOSIS — R609 Edema, unspecified: Secondary | ICD-10-CM | POA: Diagnosis not present

## 2020-01-29 DIAGNOSIS — R45851 Suicidal ideations: Secondary | ICD-10-CM | POA: Diagnosis not present

## 2020-01-29 DIAGNOSIS — R4587 Impulsiveness: Secondary | ICD-10-CM | POA: Diagnosis not present

## 2020-01-29 LAB — HEMOGLOBIN A1C
Hgb A1c MFr Bld: 6.1 % — ABNORMAL HIGH (ref 4.8–5.6)
Mean Plasma Glucose: 128 mg/dL

## 2020-01-29 MED ORDER — IBUPROFEN 600 MG PO TABS
600.0000 mg | ORAL_TABLET | Freq: Three times a day (TID) | ORAL | Status: DC | PRN
  Administered 2020-01-29: 600 mg via ORAL
  Filled 2020-01-29: qty 1

## 2020-01-29 MED ORDER — ARIPIPRAZOLE 5 MG PO TABS: 5.0000 mg | ORAL_TABLET | Freq: Every day | ORAL | 0 refills | Status: DC

## 2020-01-29 MED ORDER — TRAZODONE HCL 50 MG PO TABS
50.0000 mg | ORAL_TABLET | Freq: Every evening | ORAL | 0 refills | Status: DC | PRN
Start: 2020-01-29 — End: 2020-02-06
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
## 2020-01-29 NOTE — ED Notes (Signed)
Nurse Discharge Note:  D:Patient denies SI/HI/AVH at this time. Pt appears calm and cooperative, and no distress noted.  A: All Personal items in locker returned to pt. Pt given AVS. Pt escorted to the lobby to catch bus home..  R:  Pt states he will comply with outpatient services, and take medications as prescribed.

## 2020-01-29 NOTE — ED Notes (Signed)
Patient denies SI/HI and A/V/H. Patient given support and encouragement. Monitoring continues.

## 2020-01-29 NOTE — ED Notes (Signed)
Raisin bran given for breakfast

## 2020-01-29 NOTE — ED Notes (Signed)
Pt resting on pull out with eyes closed unlabored respirations intermittently  through night, in no acute distress. Safety maintained.  

## 2020-01-31 DIAGNOSIS — Z23 Encounter for immunization: Secondary | ICD-10-CM | POA: Diagnosis not present

## 2020-02-01 DIAGNOSIS — I10 Essential (primary) hypertension: Secondary | ICD-10-CM | POA: Diagnosis not present

## 2020-02-06 ENCOUNTER — Other Ambulatory Visit: Payer: Self-pay

## 2020-02-06 ENCOUNTER — Ambulatory Visit (INDEPENDENT_AMBULATORY_CARE_PROVIDER_SITE_OTHER): Payer: Medicare Other | Admitting: Psychiatry

## 2020-02-06 ENCOUNTER — Encounter (HOSPITAL_COMMUNITY): Payer: Self-pay | Admitting: Psychiatry

## 2020-02-06 VITALS — BP 125/81 | HR 96 | Ht 72.0 in | Wt 166.0 lb

## 2020-02-06 DIAGNOSIS — F411 Generalized anxiety disorder: Secondary | ICD-10-CM

## 2020-02-06 DIAGNOSIS — F331 Major depressive disorder, recurrent, moderate: Secondary | ICD-10-CM | POA: Diagnosis not present

## 2020-02-06 DIAGNOSIS — F3181 Bipolar II disorder: Secondary | ICD-10-CM

## 2020-02-06 DIAGNOSIS — F330 Major depressive disorder, recurrent, mild: Secondary | ICD-10-CM | POA: Insufficient documentation

## 2020-02-06 MED ORDER — TRAZODONE HCL 100 MG PO TABS: 100.0000 mg | ORAL_TABLET | Freq: Every evening | ORAL | 2 refills | Status: DC | PRN

## 2020-02-06 MED ORDER — ARIPIPRAZOLE 5 MG PO TABS: 5.0000 mg | ORAL_TABLET | Freq: Every day | ORAL | 2 refills | Status: DC

## 2020-02-06 MED ORDER — MIRTAZAPINE 15 MG PO TABS: 15.0000 mg | ORAL_TABLET | Freq: Every day | ORAL | 2 refills | Status: DC
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
## 2020-02-11 ENCOUNTER — Other Ambulatory Visit: Payer: Self-pay

## 2020-02-11 ENCOUNTER — Emergency Department (HOSPITAL_COMMUNITY)
Admission: EM | Admit: 2020-02-11 | Discharge: 2020-02-12 | Disposition: A | Discharge status: Home / Self Care | Payer: Medicare Other | Attending: Emergency Medicine | Admitting: Emergency Medicine

## 2020-02-11 ENCOUNTER — Encounter (HOSPITAL_COMMUNITY): Payer: Self-pay | Admitting: Emergency Medicine

## 2020-02-11 ENCOUNTER — Emergency Department (HOSPITAL_COMMUNITY): Payer: Medicare Other

## 2020-02-11 DIAGNOSIS — I110 Hypertensive heart disease with heart failure: Secondary | ICD-10-CM | POA: Diagnosis not present

## 2020-02-11 DIAGNOSIS — Z79899 Other long term (current) drug therapy: Secondary | ICD-10-CM | POA: Insufficient documentation

## 2020-02-11 DIAGNOSIS — L03115 Cellulitis of right lower limb: Secondary | ICD-10-CM | POA: Diagnosis not present

## 2020-02-11 DIAGNOSIS — M79604 Pain in right leg: Secondary | ICD-10-CM | POA: Diagnosis present

## 2020-02-11 DIAGNOSIS — E876 Hypokalemia: Secondary | ICD-10-CM | POA: Insufficient documentation

## 2020-02-11 DIAGNOSIS — R2241 Localized swelling, mass and lump, right lower limb: Secondary | ICD-10-CM | POA: Diagnosis not present

## 2020-02-11 DIAGNOSIS — I10 Essential (primary) hypertension: Secondary | ICD-10-CM | POA: Diagnosis not present

## 2020-02-11 DIAGNOSIS — R21 Rash and other nonspecific skin eruption: Secondary | ICD-10-CM | POA: Diagnosis not present

## 2020-02-11 DIAGNOSIS — F3181 Bipolar II disorder: Secondary | ICD-10-CM | POA: Diagnosis not present

## 2020-02-11 DIAGNOSIS — I509 Heart failure, unspecified: Secondary | ICD-10-CM | POA: Diagnosis not present

## 2020-02-11 DIAGNOSIS — R079 Chest pain, unspecified: Secondary | ICD-10-CM | POA: Diagnosis not present

## 2020-02-11 DIAGNOSIS — Z87891 Personal history of nicotine dependence: Secondary | ICD-10-CM | POA: Diagnosis not present

## 2020-02-11 LAB — CBC
HCT: 44.3 % (ref 39.0–52.0)
Hemoglobin: 13.9 g/dL (ref 13.0–17.0)
MCH: 28.3 pg (ref 26.0–34.0)
MCHC: 31.4 g/dL (ref 30.0–36.0)
MCV: 90 fL (ref 80.0–100.0)
Platelets: 316 10*3/uL (ref 150–400)
RBC: 4.92 MIL/uL (ref 4.22–5.81)
RDW: 13.6 % (ref 11.5–15.5)
WBC: 9.5 10*3/uL (ref 4.0–10.5)
nRBC: 0 % (ref 0.0–0.2)

## 2020-02-11 LAB — BASIC METABOLIC PANEL
Anion gap: 10 (ref 5–15)
BUN: 13 mg/dL (ref 8–23)
CO2: 26 mmol/L (ref 22–32)
Calcium: 9.5 mg/dL (ref 8.9–10.3)
Chloride: 97 mmol/L — ABNORMAL LOW (ref 98–111)
Creatinine, Ser: 1.26 mg/dL — ABNORMAL HIGH (ref 0.61–1.24)
GFR, Estimated: >60 mL/min (ref >60)
Glucose, Bld: 140 mg/dL — ABNORMAL HIGH (ref 70–99)
Potassium: 3.4 mmol/L — ABNORMAL LOW (ref 3.5–5.1)
Sodium: 133 mmol/L — ABNORMAL LOW (ref 135–145)

## 2020-02-11 LAB — TROPONIN I (HIGH SENSITIVITY): Troponin I (High Sensitivity): 16 ng/L (ref <18)

## 2020-02-11 LAB — BRAIN NATRIURETIC PEPTIDE: B Natriuretic Peptide: 18.9 pg/mL (ref 0.0–100.0)

## 2020-02-11 NOTE — ED Triage Notes (Signed)
Pt here for leg pain and swollen for the past 2 weeks seen  2 weeks ago.

## 2020-02-12 DIAGNOSIS — L03115 Cellulitis of right lower limb: Secondary | ICD-10-CM | POA: Diagnosis not present

## 2020-02-12 LAB — TROPONIN I (HIGH SENSITIVITY): Troponin I (High Sensitivity): 8 ng/L (ref <18)

## 2020-02-12 MED ORDER — CEPHALEXIN 500 MG PO CAPS: 500.0000 mg | ORAL_CAPSULE | Freq: Four times a day (QID) | ORAL | 0 refills | Status: DC

## 2020-02-12 MED ORDER — CEFAZOLIN SODIUM-DEXTROSE 2-4 GM/100ML-% IV SOLN
2.0000 g | Freq: Once | INTRAVENOUS | Status: AC
  Administered 2020-02-12: 2 g via INTRAVENOUS
  Filled 2020-02-12: qty 100
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
## 2020-02-12 NOTE — Discharge Instructions (Addendum)
It was our pleasure to provide your ER care today - we hope that you feel better.  Take antibiotic as prescribed. Take acetaminophen as need.   From today's labs your potassium level is mildly low - eat plenty of fruits and vegetables, and follow up with primary care doctor in 1-2 weeks. Also have your blood pressure rechecked then, as it is mildly high today.   For cellulitis/skin infection, follow up with primary care doctor in the next 2-3 days if symptoms fail to improve/resolve.  Return to ER right away if worse, new symptoms, severe pain, spreading redness, severe swelling, high fevers, chest pain, trouble breathing, or other concern.

## 2020-02-13 ENCOUNTER — Telehealth (HOSPITAL_COMMUNITY): Payer: Self-pay | Admitting: Psychiatry

## 2020-02-13 ENCOUNTER — Telehealth (HOSPITAL_COMMUNITY): Payer: Self-pay | Admitting: *Deleted

## 2020-02-13 NOTE — Telephone Encounter (Signed)
Patients son called and reported that patient is not sleeping.  Son asked if perhaps Abilify could be increases.

## 2020-02-14 ENCOUNTER — Telehealth (HOSPITAL_COMMUNITY): Payer: Self-pay | Admitting: *Deleted

## 2020-02-14 NOTE — Telephone Encounter (Signed)
Recommend trying 1.5 tabs of Trazodone 100 mg at bedtime to see if that works better and if not then may try taking 2 tabs of Trazodone 100 mg at bedtime. Give [place] feedback how that works and we will send new Rx so he does not run out.

## 2020-02-14 NOTE — Telephone Encounter (Signed)
Left a detailed message re his increase of Trazodone to help him sleep since son called yesterday to report patient only sleeping 2 hours a night. Also, left contact number for him to call back with any questions and in a few days to call back and report if he has had a benefit from the increase.

## 2020-02-14 NOTE — Telephone Encounter (Signed)
Message yesterday from son stating patient is still not sleeping more than 2 hours. Brittney NP is out today but left a message thru a secure chat to ask Dr Evelene Croon to review patiens info and order him something to help him sleep.

## 2020-02-19 ENCOUNTER — Telehealth (HOSPITAL_COMMUNITY): Payer: Self-pay | Admitting: *Deleted

## 2020-02-19 NOTE — Telephone Encounter (Signed)
Patient's son has left voice message stating that his Dad still isn't sleeping & requested a call back

## 2020-02-20 ENCOUNTER — Other Ambulatory Visit (HOSPITAL_COMMUNITY): Payer: Self-pay | Admitting: Psychiatry

## 2020-02-20 DIAGNOSIS — F3181 Bipolar II disorder: Secondary | ICD-10-CM

## 2020-02-20 MED ORDER — QUETIAPINE FUMARATE 50 MG PO TABS: 50.0000 mg | ORAL_TABLET | Freq: Every day | ORAL | 2 refills | Status: DC

## 2020-02-20 NOTE — Telephone Encounter (Signed)
Provider spoke to patient and was informed that he sleeps 2-3 hours a night. Provider recommend discontinuing Abilify and start Seroquel 50 mg nightly to help manage mood and sleep. Potential side effects of medication and risks vs benefits of treatment vs non-treatment were explained and discussed. All questions were answered. Medications sent to preferred pharmacy.

## 2020-02-20 NOTE — Telephone Encounter (Signed)
Provider spoke to patient and was informed that he sleeps 2-3 hours a night. Provider recommend discontinuing Abilify and start Seroquel 50 mg nightly to help manage mood and sleep. Potential side effects of medication and risks vs benefits of treatment vs non-treatment were explained and discussed. All questions were answered. Medications sent to preferred pharmacy.

## 2020-02-21 DIAGNOSIS — R7303 Prediabetes: Secondary | ICD-10-CM | POA: Diagnosis not present

## 2020-02-21 DIAGNOSIS — E782 Mixed hyperlipidemia: Secondary | ICD-10-CM | POA: Diagnosis not present

## 2020-02-21 DIAGNOSIS — Z23 Encounter for immunization: Secondary | ICD-10-CM | POA: Diagnosis not present

## 2020-02-21 DIAGNOSIS — I10 Essential (primary) hypertension: Secondary | ICD-10-CM | POA: Diagnosis not present

## 2020-02-21 DIAGNOSIS — I5032 Chronic diastolic (congestive) heart failure: Secondary | ICD-10-CM | POA: Diagnosis not present

## 2020-02-22 ENCOUNTER — Emergency Department (HOSPITAL_COMMUNITY)
Admission: EM | Admit: 2020-02-22 | Discharge: 2020-02-22 | Disposition: A | Discharge status: Home / Self Care | Payer: Medicare Other | Attending: Emergency Medicine | Admitting: Emergency Medicine

## 2020-02-22 ENCOUNTER — Encounter (HOSPITAL_COMMUNITY): Payer: Self-pay | Admitting: Emergency Medicine

## 2020-02-22 DIAGNOSIS — R600 Localized edema: Secondary | ICD-10-CM | POA: Insufficient documentation

## 2020-02-22 DIAGNOSIS — I509 Heart failure, unspecified: Secondary | ICD-10-CM | POA: Insufficient documentation

## 2020-02-22 DIAGNOSIS — Z87891 Personal history of nicotine dependence: Secondary | ICD-10-CM | POA: Diagnosis not present

## 2020-02-22 DIAGNOSIS — I110 Hypertensive heart disease with heart failure: Secondary | ICD-10-CM | POA: Diagnosis not present

## 2020-02-22 DIAGNOSIS — R609 Edema, unspecified: Secondary | ICD-10-CM

## 2020-02-22 MED ORDER — POTASSIUM CHLORIDE ER 10 MEQ PO TBCR
10.0000 meq | EXTENDED_RELEASE_TABLET | Freq: Every day | ORAL | 1 refills | Status: DC
Start: 2020-02-22 — End: 2020-07-01

## 2020-02-22 NOTE — ED Notes (Signed)
EDP at the bedside.  ?

## 2020-02-22 NOTE — ED Triage Notes (Signed)
Pt reports ongoing bilateral LE edema, seen here 2 weeks ago for the same, reports symptoms are not improved any.

## 2020-02-22 NOTE — ED Notes (Signed)
Patient Alert and oriented to baseline. Stable and ambulatory to baseline. Patient verbalized understanding of the discharge instructions.  Patient belongings were taken by the patient.   

## 2020-02-22 NOTE — Discharge Instructions (Addendum)
You should continue taking the water pill that your doctor prescribed for at least 2 weeks.  I prescribed you some potassium as well to take, to prevent your potassium level from getting low.  Follow-up with your primary care doctor regarding this leg swelling issue.  They should be able to manage this as an outpatient.  Number to put your legs up at home.  You should wear the compression stockings as long as possible at home.
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
## 2020-02-25 ENCOUNTER — Emergency Department (HOSPITAL_COMMUNITY)
Admission: EM | Admit: 2020-02-25 | Discharge: 2020-02-25 | Disposition: A | Discharge status: Home / Self Care | Payer: Medicare Other | Attending: Emergency Medicine | Admitting: Emergency Medicine

## 2020-02-25 ENCOUNTER — Other Ambulatory Visit: Payer: Self-pay

## 2020-02-25 ENCOUNTER — Encounter (HOSPITAL_COMMUNITY): Payer: Self-pay | Admitting: Emergency Medicine

## 2020-02-25 DIAGNOSIS — Z87891 Personal history of nicotine dependence: Secondary | ICD-10-CM | POA: Diagnosis not present

## 2020-02-25 DIAGNOSIS — I5032 Chronic diastolic (congestive) heart failure: Secondary | ICD-10-CM | POA: Diagnosis not present

## 2020-02-25 DIAGNOSIS — R600 Localized edema: Secondary | ICD-10-CM | POA: Diagnosis not present

## 2020-02-25 DIAGNOSIS — I110 Hypertensive heart disease with heart failure: Secondary | ICD-10-CM | POA: Insufficient documentation

## 2020-02-25 DIAGNOSIS — R609 Edema, unspecified: Secondary | ICD-10-CM

## 2020-02-25 NOTE — Discharge Instructions (Addendum)
Please wear compression stockings as discussed.  Continue to take your diuretic.  Please follow up with your family doctor about the symptoms you were experiencing today. Return to the ER for any new or worsening symptoms
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
## 2020-02-25 NOTE — ED Triage Notes (Signed)
Pt arriving with leg swelling, primarily in the right leg. Pt reports he is on lasix and was seen at [facility] on 02/22/20. Pt concerned that his symptoms are not improving.

## 2020-02-25 NOTE — ED Notes (Signed)
ED Provider at bedside. 

## 2020-03-19 ENCOUNTER — Other Ambulatory Visit: Payer: Self-pay

## 2020-03-19 ENCOUNTER — Ambulatory Visit (INDEPENDENT_AMBULATORY_CARE_PROVIDER_SITE_OTHER): Payer: Medicare Other | Admitting: Psychiatry

## 2020-03-19 ENCOUNTER — Encounter (HOSPITAL_COMMUNITY): Payer: Self-pay | Admitting: Psychiatry

## 2020-03-19 VITALS — BP 123/69 | HR 81

## 2020-03-19 DIAGNOSIS — F331 Major depressive disorder, recurrent, moderate: Secondary | ICD-10-CM

## 2020-03-19 DIAGNOSIS — F3181 Bipolar II disorder: Secondary | ICD-10-CM

## 2020-03-19 DIAGNOSIS — F99 Mental disorder, not otherwise specified: Secondary | ICD-10-CM

## 2020-03-19 DIAGNOSIS — F5105 Insomnia due to other mental disorder: Secondary | ICD-10-CM | POA: Diagnosis not present

## 2020-03-19 DIAGNOSIS — F411 Generalized anxiety disorder: Secondary | ICD-10-CM

## 2020-03-19 MED ORDER — ARIPIPRAZOLE 10 MG PO TABS: 10.0000 mg | ORAL_TABLET | Freq: Every day | ORAL | 0 refills | Status: DC

## 2020-03-19 MED ORDER — QUETIAPINE FUMARATE 100 MG PO TABS: 100.0000 mg | ORAL_TABLET | Freq: Every day | ORAL | 2 refills | Status: DC

## 2020-03-19 MED ORDER — ZOLPIDEM TARTRATE 10 MG PO TABS: 10.0000 mg | ORAL_TABLET | Freq: Every evening | ORAL | 0 refills | Status: DC | PRN

## 2020-03-19 MED ORDER — ARIPIPRAZOLE ER 400 MG IM PRSY
400.0000 mg | PREFILLED_SYRINGE | Freq: Once | INTRAMUSCULAR | Status: AC
  Administered 2020-03-27: 400 mg via INTRAMUSCULAR

## 2020-03-19 MED ORDER — HYDROXYZINE HCL 10 MG PO TABS: 10.0000 mg | ORAL_TABLET | Freq: Three times a day (TID) | ORAL | 2 refills | Status: DC | PRN

## 2020-03-19 MED ORDER — TRAZODONE HCL 100 MG PO TABS: 100.0000 mg | ORAL_TABLET | Freq: Every evening | ORAL | 2 refills | Status: DC | PRN
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
## 2020-03-25 ENCOUNTER — Other Ambulatory Visit (HOSPITAL_COMMUNITY): Payer: Self-pay | Admitting: Psychiatry

## 2020-03-25 DIAGNOSIS — F3181 Bipolar II disorder: Secondary | ICD-10-CM

## 2020-03-25 MED ORDER — ABILIFY MAINTENA 400 MG IM PRSY
400.0000 mg | PREFILLED_SYRINGE | INTRAMUSCULAR | 11 refills | Status: DC
Start: 2020-03-25 — End: 2020-06-19

## 2020-03-27 ENCOUNTER — Encounter (HOSPITAL_COMMUNITY): Payer: Self-pay

## 2020-03-27 ENCOUNTER — Ambulatory Visit (INDEPENDENT_AMBULATORY_CARE_PROVIDER_SITE_OTHER): Payer: Medicare Other | Admitting: *Deleted

## 2020-03-27 ENCOUNTER — Other Ambulatory Visit: Payer: Self-pay

## 2020-03-27 VITALS — BP 124/76 | HR 78

## 2020-03-27 DIAGNOSIS — F3181 Bipolar II disorder: Secondary | ICD-10-CM

## 2020-03-27 NOTE — Progress Notes (Signed)
In accompanied by his eldest son for his first injection. He received Abilify Maintena 400 mg in his R deltoid without difficulty. He has not had injections before. States he has a hx of poor compliance as well as mood swings. He is pleasant and good natured and demonstrated humor. He has a nice appearance. He walks slow, shuffles and is stooped over. When writer asked him about it he said he has COPD and that was his explanation. To return in one month for his next injection. He brings his own shot as he is on Medicaid.

## 2020-04-01 ENCOUNTER — Telehealth (HOSPITAL_COMMUNITY): Payer: Self-pay | Admitting: Internal Medicine

## 2020-04-01 NOTE — Telephone Encounter (Signed)
Care Management - Outpatient   Patient reports assistance with housing.  Patient currently lives with his son.  Patient reports that he has income from his social security benefits.   [employer]  - Holiday representative (Elderly) [employer] at [facility] [address] [place], [place] [address] Phone: [phone] Email: [email or web address] Website: [email or web address]   [facility]) [address] [place], [place] [address] [phone] [email or web address]

## 2020-04-03 NOTE — Telephone Encounter (Signed)
Concerns have been addressed by provider.

## 2020-04-24 ENCOUNTER — Ambulatory Visit (HOSPITAL_COMMUNITY): Payer: Medicare Other | Admitting: *Deleted

## 2020-04-24 ENCOUNTER — Encounter (HOSPITAL_COMMUNITY): Payer: Self-pay

## 2020-04-24 ENCOUNTER — Other Ambulatory Visit: Payer: Self-pay

## 2020-04-24 VITALS — BP 125/83 | HR 93

## 2020-04-24 DIAGNOSIS — F3181 Bipolar II disorder: Secondary | ICD-10-CM

## 2020-04-24 MED ORDER — ARIPIPRAZOLE ER 400 MG IM PRSY
400.0000 mg | PREFILLED_SYRINGE | INTRAMUSCULAR | Status: DC
  Administered 2020-04-24 – 2021-06-16 (×12): 400 mg via INTRAMUSCULAR

## 2020-04-24 NOTE — Progress Notes (Signed)
In as scheduled for his monthly injection of Abilify M 400 mg and today he requested it be given in his L deltoid and it was. He denies any psychotic sx and when asked how he was doing, he said OK, when pressed he said he wasn't sleeping well. He has trouble falling asleep and then sleeps 2 -3 hours a night. Would like to speak with his provider, his appt is in early March. Will bring concern to Dr Doyne Keel attention to see if it can be adjusted prior to his appt. To follow up in one month for his next injection.

## 2020-04-25 ENCOUNTER — Ambulatory Visit (HOSPITAL_COMMUNITY): Payer: Medicare Other

## 2020-05-08 ENCOUNTER — Encounter (HOSPITAL_COMMUNITY): Payer: Medicare Other | Admitting: Psychiatry

## 2020-05-22 ENCOUNTER — Ambulatory Visit (HOSPITAL_COMMUNITY): Payer: Medicare Other | Admitting: *Deleted

## 2020-05-22 ENCOUNTER — Encounter (HOSPITAL_COMMUNITY): Payer: Self-pay

## 2020-05-22 ENCOUNTER — Other Ambulatory Visit: Payer: Self-pay

## 2020-05-22 VITALS — BP 134/72 | HR 106 | Ht 72.0 in | Wt 163.0 lb

## 2020-05-22 DIAGNOSIS — F3181 Bipolar II disorder: Secondary | ICD-10-CM

## 2020-05-22 NOTE — Progress Notes (Signed)
In early today, scheduled for 2 but here at 11. States he had to get the bus today because his son couldn't bring him. He did not bring his injection with him. Writer called pharmacy and it was picked up on the 14th. He called his son but he didn't pick up, left him a message to replace the sample shot I gave him with the one he picked up thru [employer]. Abilify 400 mg injection given in his R deltoid without difficulty. He states he is lonely and gets nerve medicine but he feels he needs more. He will be seeing the provider here next month and also getting his next injection March 16th. No complaints offered re his mental health.

## 2020-05-29 DIAGNOSIS — I10 Essential (primary) hypertension: Secondary | ICD-10-CM | POA: Diagnosis not present

## 2020-05-29 DIAGNOSIS — I5032 Chronic diastolic (congestive) heart failure: Secondary | ICD-10-CM | POA: Diagnosis not present

## 2020-05-29 DIAGNOSIS — E782 Mixed hyperlipidemia: Secondary | ICD-10-CM | POA: Diagnosis not present

## 2020-05-29 DIAGNOSIS — R7303 Prediabetes: Secondary | ICD-10-CM | POA: Diagnosis not present

## 2020-06-17 ENCOUNTER — Encounter (HOSPITAL_COMMUNITY): Payer: Medicare Other | Admitting: Psychiatry

## 2020-06-19 ENCOUNTER — Ambulatory Visit (INDEPENDENT_AMBULATORY_CARE_PROVIDER_SITE_OTHER): Payer: Medicare Other | Admitting: *Deleted

## 2020-06-19 ENCOUNTER — Ambulatory Visit (INDEPENDENT_AMBULATORY_CARE_PROVIDER_SITE_OTHER): Payer: Medicare Other | Admitting: Psychiatry

## 2020-06-19 ENCOUNTER — Other Ambulatory Visit: Payer: Self-pay

## 2020-06-19 ENCOUNTER — Encounter (HOSPITAL_COMMUNITY): Payer: Self-pay | Admitting: Psychiatry

## 2020-06-19 ENCOUNTER — Encounter (HOSPITAL_COMMUNITY): Payer: Self-pay

## 2020-06-19 VITALS — BP 104/63 | HR 68 | Ht 72.0 in | Wt 160.0 lb

## 2020-06-19 DIAGNOSIS — F411 Generalized anxiety disorder: Secondary | ICD-10-CM | POA: Diagnosis not present

## 2020-06-19 DIAGNOSIS — F5105 Insomnia due to other mental disorder: Secondary | ICD-10-CM | POA: Diagnosis not present

## 2020-06-19 DIAGNOSIS — F3181 Bipolar II disorder: Secondary | ICD-10-CM | POA: Diagnosis not present

## 2020-06-19 DIAGNOSIS — F99 Mental disorder, not otherwise specified: Secondary | ICD-10-CM | POA: Diagnosis not present

## 2020-06-19 MED ORDER — HYDROXYZINE HCL 25 MG PO TABS: 25.0000 mg | ORAL_TABLET | Freq: Three times a day (TID) | ORAL | 2 refills | Status: DC | PRN

## 2020-06-19 MED ORDER — FLUOXETINE HCL 10 MG PO CAPS: 10.0000 mg | ORAL_CAPSULE | Freq: Every day | ORAL | 2 refills | Status: DC

## 2020-06-19 MED ORDER — ABILIFY MAINTENA 400 MG IM PRSY: 400.0000 mg | PREFILLED_SYRINGE | INTRAMUSCULAR | 11 refills | Status: DC

## 2020-06-19 MED ORDER — ZOLPIDEM TARTRATE ER 12.5 MG PO TBCR
12.5000 mg | EXTENDED_RELEASE_TABLET | Freq: Every evening | ORAL | 1 refills | Status: DC | PRN
Start: 2020-06-19 — End: 2020-09-11

## 2020-06-19 NOTE — Progress Notes (Signed)
Patient arrived for Injection & F/U  Med Mgmt Appt.  Patient looks a little weak today & weary using a cane to assist in gait.  NO SI /HO NOR AH/VH. Patient tolerated injection well in Left-Arm.
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
## 2020-07-01 ENCOUNTER — Encounter: Payer: Self-pay | Admitting: Family Medicine

## 2020-07-01 ENCOUNTER — Other Ambulatory Visit: Payer: Self-pay

## 2020-07-01 ENCOUNTER — Ambulatory Visit (INDEPENDENT_AMBULATORY_CARE_PROVIDER_SITE_OTHER): Payer: Medicare Other | Admitting: Family Medicine

## 2020-07-01 VITALS — BP 98/62 | HR 73 | Temp 97.9°F | Resp 20 | Ht 72.0 in | Wt 169.8 lb

## 2020-07-01 DIAGNOSIS — R351 Nocturia: Secondary | ICD-10-CM

## 2020-07-01 DIAGNOSIS — F99 Mental disorder, not otherwise specified: Secondary | ICD-10-CM | POA: Diagnosis not present

## 2020-07-01 DIAGNOSIS — N401 Enlarged prostate with lower urinary tract symptoms: Secondary | ICD-10-CM | POA: Diagnosis not present

## 2020-07-01 DIAGNOSIS — R600 Localized edema: Secondary | ICD-10-CM | POA: Diagnosis not present

## 2020-07-01 DIAGNOSIS — M62838 Other muscle spasm: Secondary | ICD-10-CM

## 2020-07-01 DIAGNOSIS — F5105 Insomnia due to other mental disorder: Secondary | ICD-10-CM | POA: Diagnosis not present
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
## 2020-07-01 NOTE — Patient Instructions (Signed)
Reduce Lipitor or a atorvastatin with Monday Wednesday Friday.  Take Lasix 20 mg with same frequency Monday Wednesday Friday. Use pillow on the right side of head to push neck more toward midline.  Also may use Flexeril at bedtime only for neck spasms. Take Flomax with food to reduce having to get up at night to urinate.

## 2020-07-02 ENCOUNTER — Other Ambulatory Visit: Payer: Self-pay | Admitting: Family Medicine

## 2020-07-02 ENCOUNTER — Telehealth: Payer: Self-pay | Admitting: *Deleted

## 2020-07-02 DIAGNOSIS — R351 Nocturia: Secondary | ICD-10-CM

## 2020-07-02 DIAGNOSIS — M62838 Other muscle spasm: Secondary | ICD-10-CM

## 2020-07-02 DIAGNOSIS — N401 Enlarged prostate with lower urinary tract symptoms: Secondary | ICD-10-CM

## 2020-07-02 DIAGNOSIS — R600 Localized edema: Secondary | ICD-10-CM

## 2020-07-02 LAB — PSA: PSA: 1.56 ng/mL (ref <=4.0)

## 2020-07-02 MED ORDER — CYCLOBENZAPRINE HCL 10 MG PO TABS: 10.0000 mg | ORAL_TABLET | Freq: Three times a day (TID) | ORAL | 0 refills | Status: DC | PRN

## 2020-07-02 MED ORDER — FUROSEMIDE 20 MG PO TABS: 20.0000 mg | ORAL_TABLET | Freq: Every day | ORAL | 3 refills | Status: DC

## 2020-07-02 MED ORDER — TAMSULOSIN HCL 0.4 MG PO CAPS: 0.4000 mg | ORAL_CAPSULE | Freq: Every day | ORAL | 3 refills | Status: DC

## 2020-07-02 NOTE — Progress Notes (Unsigned)
flexeril

## 2020-07-02 NOTE — Telephone Encounter (Signed)
Patient called and stated that he was seen yesterday and prescribed 2 new medications. Stated that the pharmacy never received it. He stated that he needs it sent to [facility].  Pended Rx's and sent to Dr. Hyacinth Meeker for approval.      Patient Instructions by Frederica Kuster, MD at 07/01/2020 2:00 PM  Author: Frederica Kuster, MD Author Type: Physician Filed: 07/01/2020 3:07 PM  Note Status: Signed Cosign: Cosign Not Required Encounter Date: 07/01/2020  Editor: Frederica Kuster, MD (Physician)             Reduce Lipitor or a atorvastatin with Monday Wednesday Friday.  Take Lasix 20 mg with same frequency Monday Wednesday Friday. Use pillow on the right side of head to push neck more toward midline.  Also Karie Skowron use Flexeril at bedtime only for neck spasms. Take Flomax with food to reduce having to get up at night to urinate.

## 2020-07-18 ENCOUNTER — Other Ambulatory Visit: Payer: Self-pay

## 2020-07-18 ENCOUNTER — Encounter (HOSPITAL_COMMUNITY): Payer: Self-pay

## 2020-07-18 ENCOUNTER — Ambulatory Visit (HOSPITAL_COMMUNITY): Payer: Medicare Other | Admitting: *Deleted

## 2020-07-18 VITALS — BP 109/67 | HR 71 | Ht 72.0 in | Wt 165.0 lb

## 2020-07-18 DIAGNOSIS — F3181 Bipolar II disorder: Secondary | ICD-10-CM

## 2020-07-18 NOTE — Progress Notes (Signed)
In early for todays appt for his injection. He came here today via Medicaid transport and timing can be a bit inconsistent with them. He is in good spirits, he offers no complaints. He states he is upset with one of his sons because he is selling drugs on the side and he worries about him. No special plans for the Easter Holiday, and he hasnt been to church in 9 years though prior to that he was in regular attendance. He also has stated several times he is lonely and misses talking with people, encourage him to make getting back into church a goal for himself this year. He responded with "that is a good idea" Abilify M 400 mg given today in his R deltoid without difficulty. To return in one month.

## 2020-07-29 ENCOUNTER — Other Ambulatory Visit: Payer: Self-pay | Admitting: Family Medicine

## 2020-07-29 ENCOUNTER — Other Ambulatory Visit: Payer: Self-pay

## 2020-07-29 ENCOUNTER — Ambulatory Visit (INDEPENDENT_AMBULATORY_CARE_PROVIDER_SITE_OTHER): Payer: Medicare Other | Admitting: Family Medicine

## 2020-07-29 VITALS — BP 110/66 | HR 73 | Temp 97.9°F | Resp 20 | Ht 72.0 in | Wt 166.8 lb

## 2020-07-29 DIAGNOSIS — Z0000 Encounter for general adult medical examination without abnormal findings: Secondary | ICD-10-CM | POA: Diagnosis not present

## 2020-07-29 DIAGNOSIS — R600 Localized edema: Secondary | ICD-10-CM

## 2020-07-29 DIAGNOSIS — M62838 Other muscle spasm: Secondary | ICD-10-CM | POA: Diagnosis not present

## 2020-07-29 DIAGNOSIS — F3181 Bipolar II disorder: Secondary | ICD-10-CM
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
## 2020-07-29 NOTE — Assessment & Plan Note (Signed)
Stable; continue lasix

## 2020-07-30 ENCOUNTER — Encounter: Payer: Self-pay | Admitting: Family Medicine

## 2020-07-30 LAB — HEPATITIS C ANTIBODY
Hepatitis C Ab: NONREACTIVE
SIGNAL TO CUT-OFF: 0.02 (ref <1.00)

## 2020-08-15 ENCOUNTER — Other Ambulatory Visit: Payer: Self-pay

## 2020-08-15 ENCOUNTER — Encounter (HOSPITAL_COMMUNITY): Payer: Self-pay

## 2020-08-15 ENCOUNTER — Ambulatory Visit (INDEPENDENT_AMBULATORY_CARE_PROVIDER_SITE_OTHER): Payer: Medicare Other | Admitting: *Deleted

## 2020-08-15 VITALS — BP 110/60 | HR 67 | Ht 72.0 in | Wt 164.0 lb

## 2020-08-15 DIAGNOSIS — F3181 Bipolar II disorder: Secondary | ICD-10-CM

## 2020-08-15 NOTE — Progress Notes (Signed)
Patient arrived for  ARIPiprazole ER (ABILIFY MAINTENA) 400 MG . Patient tolerated injection well in RIGHT-ARM. Patient pleasant as always. Very positive/good mood today laughing & joking. NO SI/HI NOR AH/VH.

## 2020-08-25 ENCOUNTER — Other Ambulatory Visit: Payer: Self-pay

## 2020-08-25 ENCOUNTER — Ambulatory Visit: Payer: Medicare Other | Attending: Family Medicine | Admitting: Physical Therapy

## 2020-08-25 ENCOUNTER — Encounter: Payer: Self-pay | Admitting: Physical Therapy

## 2020-08-25 DIAGNOSIS — R293 Abnormal posture: Secondary | ICD-10-CM | POA: Diagnosis not present

## 2020-08-25 DIAGNOSIS — M542 Cervicalgia: Secondary | ICD-10-CM | POA: Diagnosis not present

## 2020-08-25 DIAGNOSIS — M6281 Muscle weakness (generalized): Secondary | ICD-10-CM

## 2020-08-25 NOTE — Patient Instructions (Signed)
Access Code: [email or web address] URL: [email or web address] Date: 08/25/2020 Prepared by: Lysle Rubens  Exercises Seated Scapular Retraction - 1 x daily - 7 x weekly - 3 sets - 10 reps - 3 sec hold Seated Cervical Rotation AROM - 1 x daily - 7 x weekly - 3 sets - 10 reps Seated Cervical Extension AROM - 1 x daily - 7 x weekly - 3 sets - 10 reps Correct Seated Posture - 1 x daily - 7 x weekly - 3 sets - 10 reps
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
## 2020-09-08 ENCOUNTER — Ambulatory Visit (INDEPENDENT_AMBULATORY_CARE_PROVIDER_SITE_OTHER): Payer: Medicare Other | Admitting: Nurse Practitioner

## 2020-09-08 ENCOUNTER — Other Ambulatory Visit: Payer: Self-pay

## 2020-09-08 ENCOUNTER — Ambulatory Visit: Payer: Medicare Other | Attending: Family Medicine

## 2020-09-08 ENCOUNTER — Encounter: Payer: Self-pay | Admitting: Nurse Practitioner

## 2020-09-08 VITALS — BP 116/78 | HR 68 | Temp 97.1°F | Ht 72.0 in | Wt 166.4 lb

## 2020-09-08 DIAGNOSIS — N401 Enlarged prostate with lower urinary tract symptoms: Secondary | ICD-10-CM | POA: Diagnosis not present

## 2020-09-08 DIAGNOSIS — R300 Dysuria: Secondary | ICD-10-CM

## 2020-09-08 DIAGNOSIS — R293 Abnormal posture: Secondary | ICD-10-CM | POA: Insufficient documentation

## 2020-09-08 DIAGNOSIS — R600 Localized edema: Secondary | ICD-10-CM

## 2020-09-08 DIAGNOSIS — M542 Cervicalgia: Secondary | ICD-10-CM | POA: Insufficient documentation

## 2020-09-08 DIAGNOSIS — M6281 Muscle weakness (generalized): Secondary | ICD-10-CM | POA: Insufficient documentation

## 2020-09-08 DIAGNOSIS — R351 Nocturia: Secondary | ICD-10-CM | POA: Diagnosis not present

## 2020-09-08 LAB — POCT URINALYSIS DIPSTICK
Bilirubin, UA: NEGATIVE
Blood, UA: NEGATIVE
Glucose, UA: NEGATIVE
Ketones, UA: NEGATIVE
Leukocytes, UA: NEGATIVE
Nitrite, UA: NEGATIVE
Protein, UA: NEGATIVE
Spec Grav, UA: 1.015 (ref 1.010–1.025)
Urobilinogen, UA: 2 E.U./dL — AB
pH, UA: 6 (ref 5.0–8.0)
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
## 2020-09-08 NOTE — Patient Instructions (Signed)
Keep legs elevated above the level of the heart as much as you can throughout the day DECREASE SALT IN DIET- DASH DIET- stop eating RAMEN Use compression hose- put on in the morning and off at bedtime  Continue medication as prescribed at this time.   Peripheral Edema  Peripheral edema is swelling that is caused by a buildup of fluid. Peripheral edema most often affects the lower legs, ankles, and feet. It can also develop in the arms, hands, and face. The area of the body that has peripheral edema will look swollen. It may also feel heavy or warm. Your clothes may start to feel tight. Pressing on the area may make a temporary dent in your skin. You may not be able to move your swollen arm or leg as much as usual. There are many causes of peripheral edema. It can happen because of a complication of other conditions such as congestive heart failure, kidney disease, or a problem with your blood circulation. It also can be a side effect of certain medicines or because of an infection. It often happens to women during pregnancy. Sometimes, the cause is not known. Follow these instructions at home: Managing pain, stiffness, and swelling  Raise (elevate) your legs while you are sitting or lying down.  Move around often to prevent stiffness and to lessen swelling.  Do not sit or stand for long periods of time.  Wear support stockings as told by your health care provider.   Medicines  Take over-the-counter and prescription medicines only as told by your health care provider.  Your health care provider may prescribe medicine to help your body get rid of excess water (diuretic). General instructions  Pay attention to any changes in your symptoms.  Follow instructions from your health care provider about limiting salt (sodium) in your diet. Sometimes, eating less salt may reduce swelling.  Moisturize skin daily to help prevent skin from cracking and draining.  Keep all follow-up visits as told  by your health care provider. This is important. Contact a health care provider if you have:  A fever.  Edema that starts suddenly or is getting worse, especially if you are pregnant or have a medical condition.  Swelling in only one leg.  Increased swelling, redness, or pain in one or both of your legs.  Drainage or sores at the area where you have edema. Get help right away if you:  Develop shortness of breath, especially when you are lying down.  Have pain in your chest or abdomen.  Feel weak.  Feel faint. Summary  Peripheral edema is swelling that is caused by a buildup of fluid. Peripheral edema most often affects the lower legs, ankles, and feet.  Move around often to prevent stiffness and to lessen swelling. Do not sit or stand for long periods of time.  Pay attention to any changes in your symptoms.  Contact a health care provider if you have edema that starts suddenly or is getting worse, especially if you are pregnant or have a medical condition.  Get help right away if you develop shortness of breath, especially when lying down. This information is not intended to replace advice given to you by your health care provider. Make sure you discuss any questions you have with your health care provider. Document Revised: 12/14/2017 Document Reviewed: 12/14/2017 Elsevier Patient Education  2021 [employer].

## 2020-09-09 LAB — BASIC METABOLIC PANEL WITH GFR
BUN: 10 mg/dL (ref 7–25)
CO2: 27 mmol/L (ref 20–32)
Calcium: 9.6 mg/dL (ref 8.6–10.3)
Chloride: 102 mmol/L (ref 98–110)
Creat: 0.93 mg/dL (ref 0.70–1.25)
GFR, Est African American: 98 mL/min/{1.73_m2} (ref >=60)
GFR, Est Non African American: 85 mL/min/{1.73_m2} (ref >=60)
Glucose, Bld: 83 mg/dL (ref 65–99)
Potassium: 4 mmol/L (ref 3.5–5.3)
Sodium: 137 mmol/L (ref 135–146)

## 2020-09-09 LAB — CBC WITH DIFFERENTIAL/PLATELET
Absolute Monocytes: 549 cells/uL (ref 200–950)
Basophils Absolute: 41 cells/uL (ref 0–200)
Basophils Relative: 0.7 %
Eosinophils Absolute: 83 cells/uL (ref 15–500)
Eosinophils Relative: 1.4 %
HCT: 38.5 % (ref 38.5–50.0)
Hemoglobin: 12.4 g/dL — ABNORMAL LOW (ref 13.2–17.1)
Lymphs Abs: 1764 cells/uL (ref 850–3900)
MCH: 28.5 pg (ref 27.0–33.0)
MCHC: 32.2 g/dL (ref 32.0–36.0)
MCV: 88.5 fL (ref 80.0–100.0)
MPV: 10.8 fL (ref 7.5–12.5)
Monocytes Relative: 9.3 %
Neutro Abs: 3463 cells/uL (ref 1500–7800)
Neutrophils Relative %: 58.7 %
Platelets: 247 10*3/uL (ref 140–400)
RBC: 4.35 10*6/uL (ref 4.20–5.80)
RDW: 11.7 % (ref 11.0–15.0)
Total Lymphocyte: 29.9 %
WBC: 5.9 10*3/uL (ref 3.8–10.8)

## 2020-09-09 LAB — BRAIN NATRIURETIC PEPTIDE: Brain Natriuretic Peptide: 14 pg/mL (ref <100)

## 2020-09-11 ENCOUNTER — Ambulatory Visit (HOSPITAL_COMMUNITY): Payer: Medicare Other

## 2020-09-11 ENCOUNTER — Other Ambulatory Visit: Payer: Self-pay

## 2020-09-11 ENCOUNTER — Ambulatory Visit (INDEPENDENT_AMBULATORY_CARE_PROVIDER_SITE_OTHER): Payer: Medicare Other | Admitting: Psychiatry

## 2020-09-11 ENCOUNTER — Ambulatory Visit: Payer: Medicare Other

## 2020-09-11 ENCOUNTER — Encounter (HOSPITAL_COMMUNITY): Payer: Self-pay | Admitting: Psychiatry

## 2020-09-11 VITALS — BP 125/68 | HR 70 | Ht 72.0 in | Wt 166.4 lb

## 2020-09-11 DIAGNOSIS — M6281 Muscle weakness (generalized): Secondary | ICD-10-CM

## 2020-09-11 DIAGNOSIS — F3181 Bipolar II disorder: Secondary | ICD-10-CM | POA: Diagnosis not present

## 2020-09-11 DIAGNOSIS — F99 Mental disorder, not otherwise specified: Secondary | ICD-10-CM | POA: Diagnosis not present

## 2020-09-11 DIAGNOSIS — M542 Cervicalgia: Secondary | ICD-10-CM | POA: Diagnosis not present

## 2020-09-11 DIAGNOSIS — F5105 Insomnia due to other mental disorder: Secondary | ICD-10-CM | POA: Diagnosis not present

## 2020-09-11 DIAGNOSIS — F411 Generalized anxiety disorder: Secondary | ICD-10-CM

## 2020-09-11 DIAGNOSIS — R293 Abnormal posture: Secondary | ICD-10-CM

## 2020-09-11 MED ORDER — ZOLPIDEM TARTRATE ER 12.5 MG PO TBCR: 12.5000 mg | EXTENDED_RELEASE_TABLET | Freq: Every evening | ORAL | 1 refills | Status: DC | PRN

## 2020-09-11 MED ORDER — HYDROXYZINE HCL 10 MG PO TABS
10.0000 mg | ORAL_TABLET | Freq: Three times a day (TID) | ORAL | 2 refills | Status: DC
Start: 2020-09-11 — End: 2020-12-30

## 2020-09-11 MED ORDER — ABILIFY MAINTENA 400 MG IM PRSY: 400.0000 mg | PREFILLED_SYRINGE | INTRAMUSCULAR | 11 refills | Status: DC

## 2020-09-11 MED ORDER — FLUOXETINE HCL 20 MG PO CAPS: 20.0000 mg | ORAL_CAPSULE | Freq: Every day | ORAL | 2 refills | Status: DC

## 2020-09-11 MED ORDER — BENZTROPINE MESYLATE 0.5 MG PO TABS: 0.5000 mg | ORAL_TABLET | Freq: Two times a day (BID) | ORAL | 2 refills | Status: DC
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
## 2020-09-11 NOTE — Progress Notes (Signed)
In as scheduled for his monthly injection of Abilify M 400 mg, he got his shot today in his L deltoid. He forgot his injection today, and was unable to reach his son. He has MCD and brings his own. He was given a sample injection today and on his next appt he is to bring two shots, one to pay back the sample used today and the second one for his monthly injection. He expressed his appreciation for the loan and promised he would return with two. Suggested he speak with his son about changing pharmacies to one that delivers, might make it easier for him. He was able to come today via [employer] transport. He is also seeing the provider today. He reports his younger sister passed away from a terminal illness since I saw him last and that today is his younger sons birthday. He offers no complaints. He is to return for his next injection in one month.

## 2020-09-15 ENCOUNTER — Other Ambulatory Visit: Payer: Self-pay

## 2020-09-15 ENCOUNTER — Ambulatory Visit: Payer: Medicare Other

## 2020-09-15 DIAGNOSIS — R293 Abnormal posture: Secondary | ICD-10-CM | POA: Diagnosis not present

## 2020-09-15 DIAGNOSIS — M6281 Muscle weakness (generalized): Secondary | ICD-10-CM | POA: Diagnosis not present

## 2020-09-15 DIAGNOSIS — M542 Cervicalgia: Secondary | ICD-10-CM | POA: Diagnosis not present
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
## 2020-09-16 ENCOUNTER — Encounter (HOSPITAL_COMMUNITY): Payer: Medicare Other | Admitting: Psychiatry

## 2020-09-17 ENCOUNTER — Encounter (HOSPITAL_COMMUNITY): Payer: Medicare Other | Admitting: Psychiatry

## 2020-09-18 ENCOUNTER — Ambulatory Visit: Payer: Medicare Other

## 2020-09-22 ENCOUNTER — Ambulatory Visit: Payer: Medicare Other | Admitting: Physical Therapy

## 2020-09-22 ENCOUNTER — Other Ambulatory Visit: Payer: Self-pay

## 2020-09-22 ENCOUNTER — Encounter: Payer: Self-pay | Admitting: Physical Therapy

## 2020-09-22 DIAGNOSIS — M542 Cervicalgia: Secondary | ICD-10-CM | POA: Diagnosis not present

## 2020-09-22 DIAGNOSIS — M6281 Muscle weakness (generalized): Secondary | ICD-10-CM

## 2020-09-22 DIAGNOSIS — R293 Abnormal posture: Secondary | ICD-10-CM | POA: Diagnosis not present
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
## 2020-09-25 ENCOUNTER — Encounter: Payer: Self-pay | Admitting: Physical Therapy

## 2020-09-25 ENCOUNTER — Ambulatory Visit: Payer: Medicare Other | Admitting: Physical Therapy

## 2020-09-25 ENCOUNTER — Other Ambulatory Visit: Payer: Self-pay

## 2020-09-25 DIAGNOSIS — M6281 Muscle weakness (generalized): Secondary | ICD-10-CM

## 2020-09-25 DIAGNOSIS — M542 Cervicalgia: Secondary | ICD-10-CM

## 2020-09-25 DIAGNOSIS — R293 Abnormal posture: Secondary | ICD-10-CM
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
## 2020-09-29 ENCOUNTER — Ambulatory Visit: Payer: Medicare Other | Admitting: Physical Therapy

## 2020-10-02 ENCOUNTER — Encounter: Payer: Self-pay | Admitting: Physical Therapy

## 2020-10-02 ENCOUNTER — Other Ambulatory Visit: Payer: Self-pay

## 2020-10-02 ENCOUNTER — Ambulatory Visit: Payer: Medicare Other | Admitting: Physical Therapy

## 2020-10-02 DIAGNOSIS — M542 Cervicalgia: Secondary | ICD-10-CM | POA: Diagnosis not present

## 2020-10-02 DIAGNOSIS — M6281 Muscle weakness (generalized): Secondary | ICD-10-CM | POA: Diagnosis not present

## 2020-10-02 DIAGNOSIS — R293 Abnormal posture: Secondary | ICD-10-CM | POA: Diagnosis not present
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
## 2020-10-09 ENCOUNTER — Ambulatory Visit (HOSPITAL_COMMUNITY): Payer: Medicare Other

## 2020-10-13 ENCOUNTER — Ambulatory Visit: Payer: Medicare Other | Attending: Family Medicine | Admitting: Physical Therapy

## 2020-10-13 DIAGNOSIS — R293 Abnormal posture: Secondary | ICD-10-CM | POA: Insufficient documentation

## 2020-10-13 DIAGNOSIS — M6281 Muscle weakness (generalized): Secondary | ICD-10-CM | POA: Insufficient documentation

## 2020-10-13 DIAGNOSIS — M542 Cervicalgia: Secondary | ICD-10-CM | POA: Insufficient documentation

## 2020-10-14 ENCOUNTER — Ambulatory Visit: Payer: Medicare Other | Admitting: Family Medicine

## 2020-10-16 ENCOUNTER — Encounter: Payer: Self-pay | Admitting: Physical Therapy

## 2020-10-16 ENCOUNTER — Other Ambulatory Visit: Payer: Self-pay

## 2020-10-16 ENCOUNTER — Ambulatory Visit: Payer: Medicare Other | Admitting: Physical Therapy

## 2020-10-16 DIAGNOSIS — M6281 Muscle weakness (generalized): Secondary | ICD-10-CM | POA: Diagnosis not present

## 2020-10-16 DIAGNOSIS — R293 Abnormal posture: Secondary | ICD-10-CM

## 2020-10-16 DIAGNOSIS — M542 Cervicalgia: Secondary | ICD-10-CM | POA: Diagnosis not present
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
## 2020-10-17 ENCOUNTER — Ambulatory Visit (INDEPENDENT_AMBULATORY_CARE_PROVIDER_SITE_OTHER): Payer: Medicare Other | Admitting: Family

## 2020-10-17 ENCOUNTER — Other Ambulatory Visit: Payer: Self-pay

## 2020-10-17 ENCOUNTER — Encounter: Payer: Self-pay | Admitting: Family

## 2020-10-17 VITALS — BP 120/80 | HR 84 | Temp 98.0°F | Resp 16 | Ht 72.0 in | Wt 163.2 lb

## 2020-10-17 DIAGNOSIS — N401 Enlarged prostate with lower urinary tract symptoms: Secondary | ICD-10-CM | POA: Diagnosis not present

## 2020-10-17 DIAGNOSIS — R351 Nocturia: Secondary | ICD-10-CM

## 2020-10-17 DIAGNOSIS — I509 Heart failure, unspecified: Secondary | ICD-10-CM | POA: Insufficient documentation

## 2020-10-17 DIAGNOSIS — Z1211 Encounter for screening for malignant neoplasm of colon: Secondary | ICD-10-CM | POA: Diagnosis not present

## 2020-10-17 DIAGNOSIS — E782 Mixed hyperlipidemia: Secondary | ICD-10-CM

## 2020-10-17 DIAGNOSIS — F3181 Bipolar II disorder: Secondary | ICD-10-CM | POA: Diagnosis not present

## 2020-10-17 DIAGNOSIS — I5032 Chronic diastolic (congestive) heart failure: Secondary | ICD-10-CM

## 2020-10-17 DIAGNOSIS — R7303 Prediabetes: Secondary | ICD-10-CM | POA: Diagnosis not present

## 2020-10-17 DIAGNOSIS — Z23 Encounter for immunization: Secondary | ICD-10-CM | POA: Diagnosis not present

## 2020-10-17 DIAGNOSIS — L602 Onychogryphosis: Secondary | ICD-10-CM

## 2020-10-17 DIAGNOSIS — R600 Localized edema: Secondary | ICD-10-CM

## 2020-10-17 MED ORDER — TETANUS-DIPHTH-ACELL PERTUSSIS 5-2.5-18.5 LF-MCG/0.5 IM SUSP: 0.5000 mL | Freq: Once | INTRAMUSCULAR | 0 refills | Status: AC

## 2020-10-17 NOTE — Patient Instructions (Signed)
Please get your shingrix vaccine and 3 rd COVID-19 booster vaccine. Also get your Tetanus vaccine at your pharmacy script send today.   [email or web address]">  DASH Eating Plan DASH stands for Dietary Approaches to Stop Hypertension. The DASH eating plan is a healthy eating plan that has been shown to: Reduce high blood pressure (hypertension). Reduce your risk for type 2 diabetes, heart disease, and stroke. Help with weight loss. What are tips for following this plan? Reading food labels Check food labels for the amount of salt (sodium) per serving. Choose foods with less than 5 percent of the Daily Value of sodium. Generally, foods with less than 300 milligrams (mg) of sodium per serving fit into this eating plan. To find whole grains, look for the word "whole" as the first word in the ingredient list. Shopping Buy products labeled as "low-sodium" or "no salt added." Buy fresh foods. Avoid canned foods and pre-made or frozen meals. Cooking Avoid adding salt when cooking. Use salt-free seasonings or herbs instead of table salt or sea salt. Check with your health care provider or pharmacist before using salt substitutes. Do not fry foods. Cook foods using healthy methods such as baking, boiling, grilling, roasting, and broiling instead. Cook with heart-healthy oils, such as olive, canola, avocado, soybean, or sunflower oil. Meal planning  Eat a balanced diet that includes: 4 or more servings of fruits and 4 or more servings of vegetables each day. Try to fill one-half of your plate with fruits and vegetables. 6-8 servings of whole grains each day. Less than 6 oz (170 g) of lean meat, poultry, or fish each day. A 3-oz (85-g) serving of meat is about the same size as a deck of cards. One egg equals 1 oz (28 g). 2-3 servings of low-fat dairy each day. One serving is 1 cup (237 mL). 1 serving of nuts, seeds, or beans 5 times each week. 2-3 servings  of heart-healthy fats. Healthy fats called omega-3 fatty acids are found in foods such as walnuts, flaxseeds, fortified milks, and eggs. These fats are also found in cold-water fish, such as sardines, salmon, and mackerel. Limit how much you eat of: Canned or prepackaged foods. Food that is high in trans fat, such as some fried foods. Food that is high in saturated fat, such as fatty meat. Desserts and other sweets, sugary drinks, and other foods with added sugar. Full-fat dairy products. Do not salt foods before eating. Do not eat more than 4 egg yolks a week. Try to eat at least 2 vegetarian meals a week. Eat more home-cooked food and less restaurant, buffet, and fast food.  Lifestyle When eating at a restaurant, ask that your food be prepared with less salt or no salt, if possible. If you drink alcohol: Limit how much you use to: 0-1 drink a day for women who are not pregnant. 0-2 drinks a day for men. Be aware of how much alcohol is in your drink. In the [place] one drink equals one 12 oz bottle of beer (355 mL), one 5 oz glass of wine (148 mL), or one 1 oz glass of hard liquor (44 mL). General information Avoid eating more than 2,300 mg of salt a day. If you have hypertension, you may need to reduce your sodium intake to 1,500 mg a day. Work with your health care provider to maintain a healthy body weight or to lose weight. Ask what an ideal weight is for you. Get at least 30 minutes of exercise that causes your  heart to beat faster (aerobic exercise) most days of the week. Activities may include walking, swimming, or biking. Work with your health care provider or dietitian to adjust your eating plan to your individual calorie needs. What foods should I eat? Fruits All fresh, dried, or frozen fruit. Canned fruit in natural juice (without addedsugar). Vegetables Fresh or frozen vegetables (raw, steamed, roasted, or grilled). Low-sodium or reduced-sodium tomato and vegetable juice.  Low-sodium or reduced-sodium tomatosauce and tomato paste. Low-sodium or reduced-sodium canned vegetables. Grains Whole-grain or whole-wheat bread. Whole-grain or whole-wheat pasta. Brown rice. Todd Zavala. Bulgur. Whole-grain and low-sodium cereals. Pita bread.Low-fat, low-sodium crackers. Whole-wheat flour tortillas. Meats and other proteins Skinless chicken or [name]. Ground chicken or [name]. Pork with fat trimmed off. Fish and seafood. Egg whites. Dried beans, peas, or lentils. Unsalted nuts, nut butters, and seeds. Unsalted canned beans. Lean cuts of beef with fat trimmed off. Low-sodium, lean precooked or cured meat, such as sausages or meatloaves. Dairy Low-fat (1%) or fat-free (skim) milk. Reduced-fat, low-fat, or fat-free cheeses. Nonfat, low-sodium ricotta or cottage cheese. Low-fat or nonfatyogurt. Low-fat, low-sodium cheese. Fats and oils Soft margarine without trans fats. Vegetable oil. Reduced-fat, low-fat, or light mayonnaise and salad dressings (reduced-sodium). Canola, safflower, olive, avocado, soybean, andsunflower oils. Avocado. Seasonings and condiments Herbs. Spices. Seasoning mixes without salt. Other foods Unsalted popcorn and pretzels. Fat-free sweets. The items listed above may not be a complete list of foods and beverages you can eat. Contact a dietitian for more information. What foods should I avoid? Fruits Canned fruit in a light or heavy syrup. Fried fruit. Fruit in cream or buttersauce. Vegetables Creamed or fried vegetables. Vegetables in a cheese sauce. Regular canned vegetables (not low-sodium or reduced-sodium). Regular canned tomato sauce and paste (not low-sodium or reduced-sodium). Regular tomato and vegetable juice(not low-sodium or reduced-sodium). Todd Zavala. Olives. Grains Baked goods made with fat, such as croissants, muffins, or some breads. [name] or rice meal packs. Meats and other proteins Fatty cuts of meat. Ribs. Fried meat. Todd Zavala. Bologna,  salami, and other precooked or cured meats, such as sausages or meat loaves. Fat from the back of a pig (fatback). Bratwurst. Salted nuts and seeds. Canned beans with added salt. Canned orsmoked fish. Whole eggs or egg yolks. Chicken or [name] with skin. Dairy Whole or 2% milk, cream, and half-and-half. Whole or full-fat cream cheese. Whole-fat or sweetened yogurt. Full-fat cheese. Nondairy creamers. Whippedtoppings. Processed cheese and cheese spreads. Fats and oils Butter. Stick margarine. Lard. Shortening. Ghee. Bacon fat. Tropical oils, suchas coconut, palm kernel, or palm oil. Seasonings and condiments Onion salt, garlic salt, seasoned salt, table salt, and sea salt. Worcestershire sauce. Tartar sauce. Barbecue sauce. Teriyaki sauce. Soy sauce, including reduced-sodium. Steak sauce. Canned and packaged gravies. Fish sauce. Oyster sauce. Cocktail sauce. Store-bought horseradish. Ketchup. Mustard. Meat flavorings and tenderizers. Bouillon cubes. Hot sauces. Pre-made or packaged marinades. Pre-made or packaged taco seasonings. Relishes. Regular saladdressings. Other foods Salted popcorn and pretzels. The items listed above may not be a complete list of foods and beverages you should avoid. Contact a dietitian for more information. Where to find more information National Heart, Lung, and Blood Institute: PopSteam.is American Heart Association: www.heart.org Academy of Nutrition and Dietetics: www.eatright.org National Kidney Foundation: www.kidney.org Summary The DASH eating plan is a healthy eating plan that has been shown to reduce high blood pressure (hypertension). It may also reduce your risk for type 2 diabetes, heart disease, and stroke. When on the DASH eating plan, aim to eat more fresh fruits  and vegetables, whole grains, lean proteins, low-fat dairy, and heart-healthy fats. With the DASH eating plan, you should limit salt (sodium) intake to 2,300 mg a day. If you have  hypertension, you may need to reduce your sodium intake to 1,500 mg a day. Work with your health care provider or dietitian to adjust your eating plan to your individual calorie needs. This information is not intended to replace advice given to you by your health care provider. Make sure you discuss any questions you have with your healthcare provider. Document Revised: 02/23/2019 Document Reviewed: 02/23/2019 Elsevier Patient Education  2022 [employer].
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
## 2020-10-18 DIAGNOSIS — Z23 Encounter for immunization: Secondary | ICD-10-CM | POA: Diagnosis not present

## 2020-10-23 ENCOUNTER — Other Ambulatory Visit: Payer: Self-pay

## 2020-10-23 ENCOUNTER — Ambulatory Visit: Payer: Medicare Other

## 2020-10-23 DIAGNOSIS — M6281 Muscle weakness (generalized): Secondary | ICD-10-CM

## 2020-10-23 DIAGNOSIS — R293 Abnormal posture: Secondary | ICD-10-CM | POA: Diagnosis not present

## 2020-10-23 DIAGNOSIS — M542 Cervicalgia: Secondary | ICD-10-CM
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
## 2020-10-24 ENCOUNTER — Ambulatory Visit (HOSPITAL_COMMUNITY): Payer: Medicare Other | Admitting: *Deleted

## 2020-10-24 ENCOUNTER — Encounter (HOSPITAL_COMMUNITY): Payer: Self-pay

## 2020-10-24 VITALS — BP 138/84 | HR 99 | Ht 72.0 in | Wt 162.0 lb

## 2020-10-24 DIAGNOSIS — F3181 Bipolar II disorder: Secondary | ICD-10-CM

## 2020-10-24 NOTE — Progress Notes (Signed)
In unexpectedly for his monthly injection. He is a month late, he missed his June appt, states he forgot about it. He did remember to bring two injections of Abilify with him, I loaned him one when he last came for his injection because he forgot to bring his own as he has MCD. He walked back to clinic without a cane, he is going to PT weekly and has been for about one month and is doing better. He has a good personal appearance, is pleasant and appropriate and got here with his eldest son and his wife. He said everything is going well for him. Today he got his injection of Abilify M 400 mg in his L DELTOID without difficulty. He is to return in one month.

## 2020-10-30 ENCOUNTER — Other Ambulatory Visit: Payer: Self-pay

## 2020-10-30 ENCOUNTER — Ambulatory Visit: Payer: Medicare Other

## 2020-10-30 DIAGNOSIS — M6281 Muscle weakness (generalized): Secondary | ICD-10-CM | POA: Diagnosis not present

## 2020-10-30 DIAGNOSIS — M542 Cervicalgia: Secondary | ICD-10-CM

## 2020-10-30 DIAGNOSIS — R293 Abnormal posture: Secondary | ICD-10-CM
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
## 2020-11-07 ENCOUNTER — Ambulatory Visit (INDEPENDENT_AMBULATORY_CARE_PROVIDER_SITE_OTHER): Payer: Medicare Other | Admitting: Podiatry

## 2020-11-07 DIAGNOSIS — Z5329 Procedure and treatment not carried out because of patient's decision for other reasons: Secondary | ICD-10-CM

## 2020-11-12 ENCOUNTER — Ambulatory Visit: Payer: Medicare Other | Admitting: Physical Therapy

## 2020-11-19 ENCOUNTER — Encounter: Payer: Medicare Other | Admitting: Physical Therapy

## 2020-11-21 ENCOUNTER — Ambulatory Visit (HOSPITAL_COMMUNITY): Payer: Medicare Other | Admitting: *Deleted

## 2020-11-21 ENCOUNTER — Other Ambulatory Visit: Payer: Self-pay

## 2020-11-21 ENCOUNTER — Encounter (HOSPITAL_COMMUNITY): Payer: Self-pay

## 2020-11-21 VITALS — BP 144/78 | HR 101 | Ht 72.0 in | Wt 156.0 lb

## 2020-11-21 DIAGNOSIS — F3181 Bipolar II disorder: Secondary | ICD-10-CM
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
## 2020-11-21 NOTE — Progress Notes (Signed)
Patient ID: Todd Zavala, male   DOB: June 28, 1955, 65 y.o.   MRN: [id number] In as scheduled for his monthly injection of Abilify M 400 mg given today in his R DELTOID without difficulty.His son brought him to his appt today but waited for him in the car. He walked in today without use of his cane, continues to go to PT twice a week. Also, in the past month has started going back to church which we discussed him doing several months ago. He offers no complaints and states he is doing well. He is to return in one month for his next injection and med Sept he will have a virtual appt on Tues with his new provider.

## 2020-11-24 ENCOUNTER — Encounter: Payer: Medicare Other | Admitting: Physical Therapy

## 2020-11-26 ENCOUNTER — Ambulatory Visit: Payer: Medicare Other

## 2020-11-27 ENCOUNTER — Other Ambulatory Visit: Payer: Self-pay

## 2020-11-27 ENCOUNTER — Ambulatory Visit: Payer: Medicare Other | Attending: Family Medicine | Admitting: Physical Therapy

## 2020-11-27 DIAGNOSIS — R293 Abnormal posture: Secondary | ICD-10-CM | POA: Diagnosis not present

## 2020-11-27 DIAGNOSIS — M6281 Muscle weakness (generalized): Secondary | ICD-10-CM | POA: Diagnosis not present

## 2020-11-27 DIAGNOSIS — M542 Cervicalgia: Secondary | ICD-10-CM | POA: Diagnosis not present
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
## 2020-11-29 IMAGING — DX DG CHEST 2V
2 series · 2 of 2 positions shown · non-contrast
Comparison: Chest radiograph dated 01/27/2020.

CLINICAL DATA: 64-year-old male with chest pain.

EXAM:
CHEST - 2 VIEW

[chest pa]
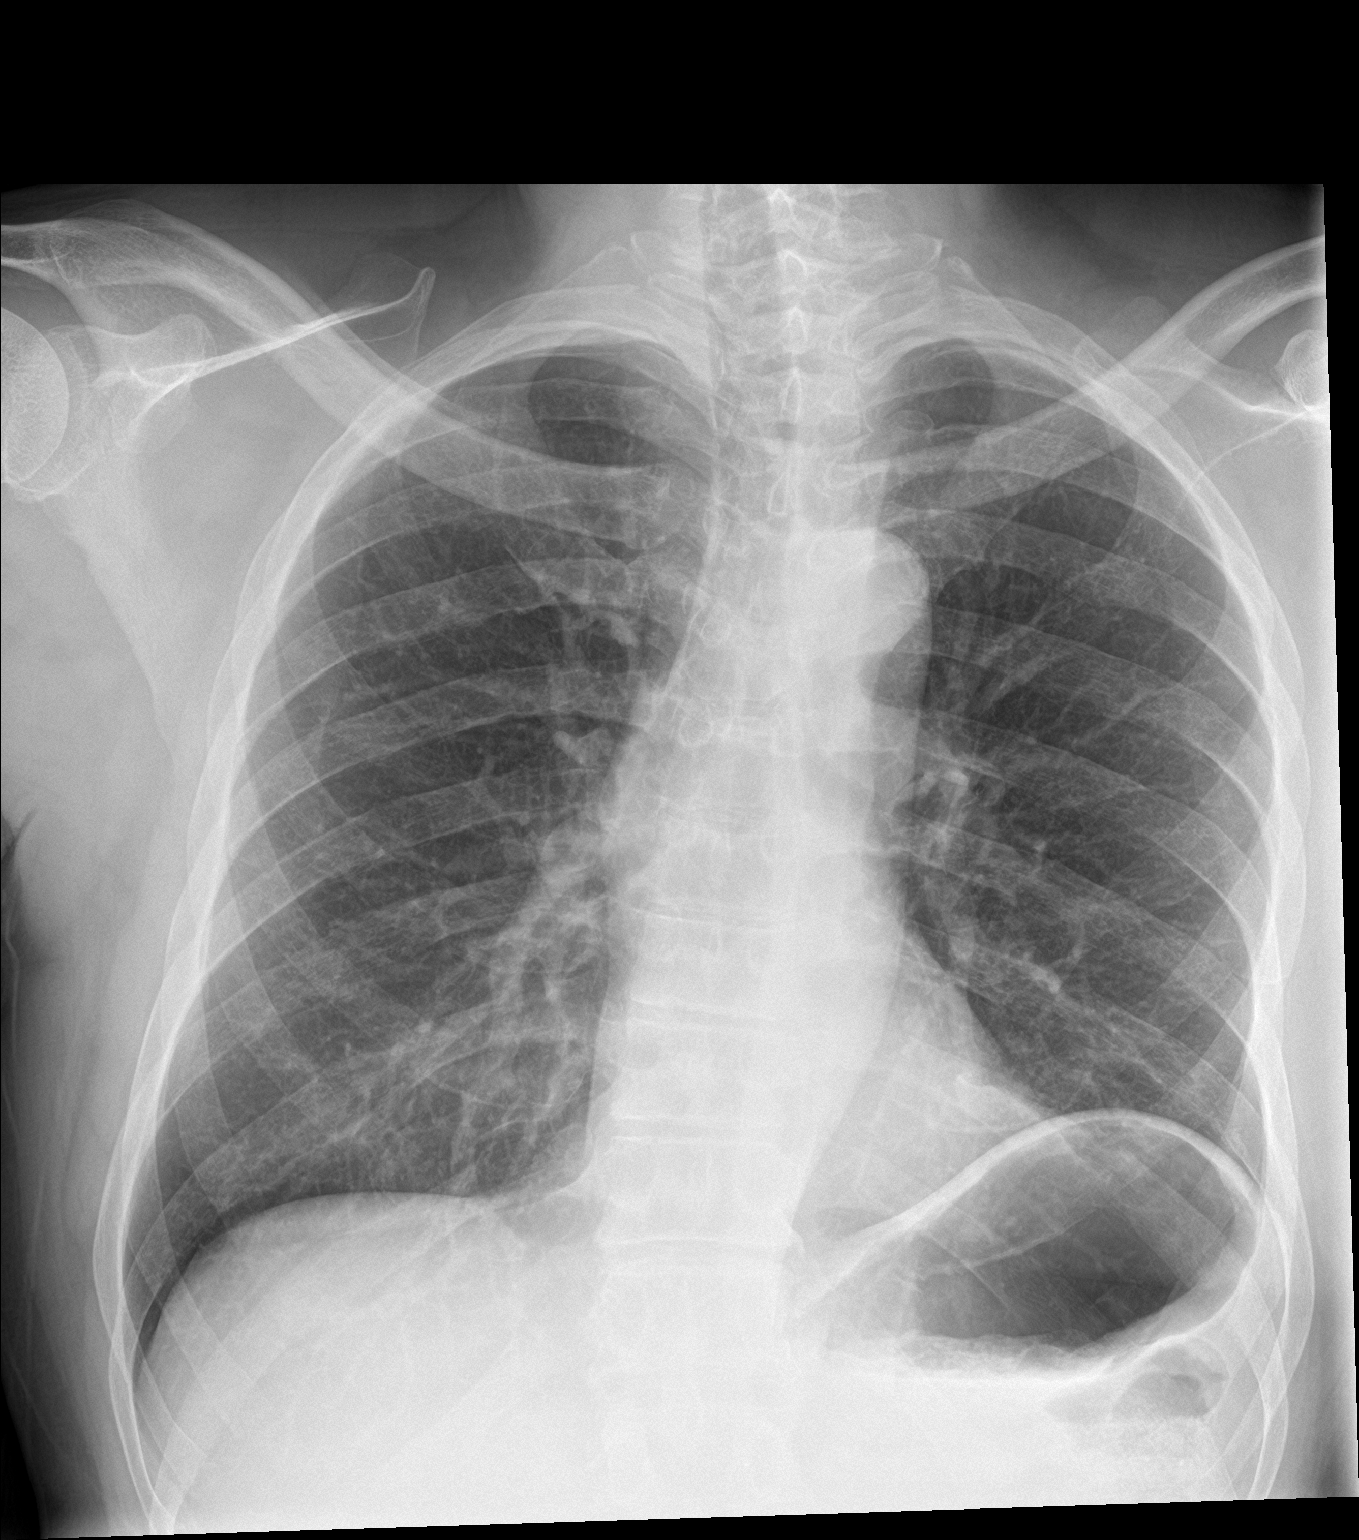

[chest lat]
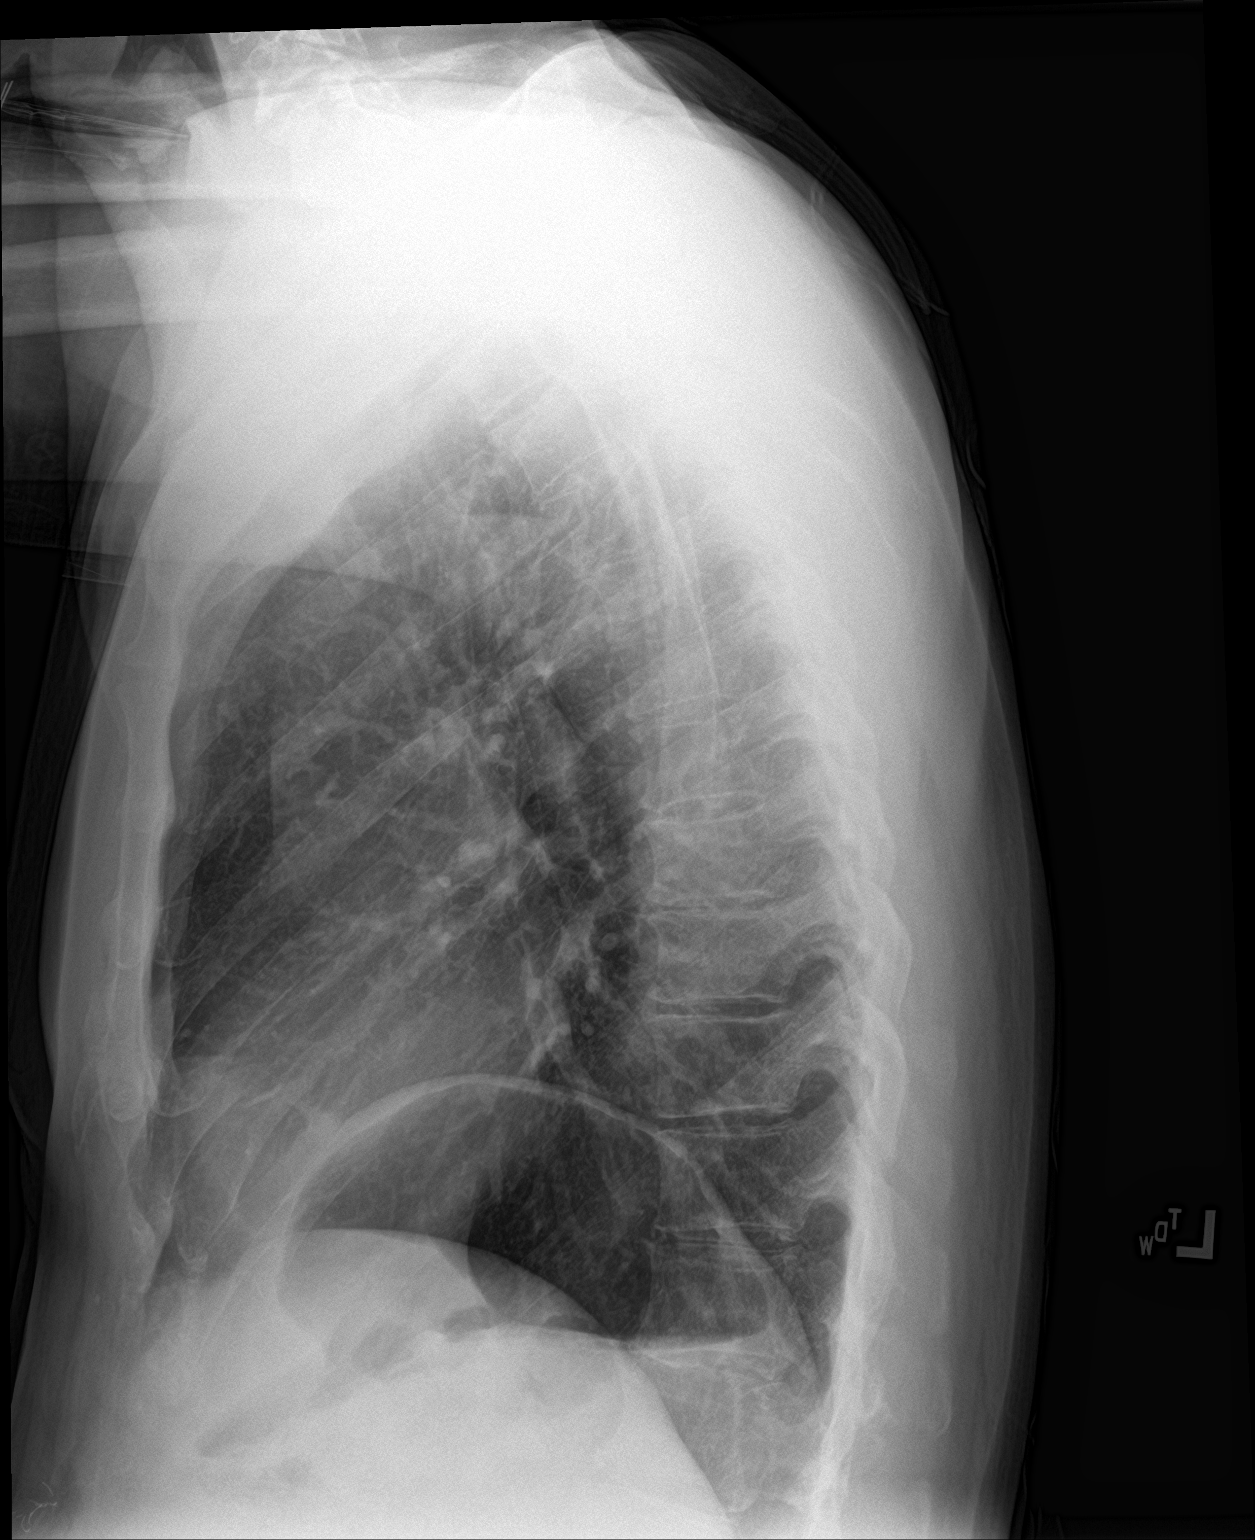

[2 of 2 positions shown; findings below may reference images not displayed]

FINDINGS: The lungs are clear. There is no pleural effusion pneumothorax. The
cardiac silhouette is within limits. No acute osseous pathology.
IMPRESSION: No active cardiopulmonary disease.

## 2020-12-03 ENCOUNTER — Ambulatory Visit: Payer: Medicare Other

## 2020-12-03 ENCOUNTER — Other Ambulatory Visit: Payer: Self-pay

## 2020-12-03 DIAGNOSIS — R293 Abnormal posture: Secondary | ICD-10-CM

## 2020-12-03 DIAGNOSIS — M6281 Muscle weakness (generalized): Secondary | ICD-10-CM

## 2020-12-03 DIAGNOSIS — M542 Cervicalgia: Secondary | ICD-10-CM
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
## 2020-12-03 NOTE — Addendum Note (Signed)
Addended by: Jearld Lesch on: 12/03/2020 03:27 PM   Modules accepted: Orders

## 2020-12-04 ENCOUNTER — Ambulatory Visit: Payer: Medicare Other

## 2020-12-10 ENCOUNTER — Other Ambulatory Visit: Payer: Self-pay

## 2020-12-10 ENCOUNTER — Ambulatory Visit: Payer: Medicare Other | Attending: Family Medicine

## 2020-12-10 DIAGNOSIS — M6281 Muscle weakness (generalized): Secondary | ICD-10-CM | POA: Insufficient documentation

## 2020-12-10 DIAGNOSIS — R293 Abnormal posture: Secondary | ICD-10-CM | POA: Insufficient documentation

## 2020-12-10 DIAGNOSIS — M542 Cervicalgia: Secondary | ICD-10-CM | POA: Insufficient documentation
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
## 2020-12-11 ENCOUNTER — Ambulatory Visit (HOSPITAL_COMMUNITY): Payer: Medicare Other | Admitting: Psychiatry

## 2020-12-11 ENCOUNTER — Ambulatory Visit: Payer: Medicare Other

## 2020-12-16 ENCOUNTER — Ambulatory Visit: Payer: Medicare Other | Admitting: Physical Therapy

## 2020-12-18 ENCOUNTER — Ambulatory Visit: Payer: Medicare Other | Admitting: Physical Therapy

## 2020-12-18 ENCOUNTER — Other Ambulatory Visit: Payer: Self-pay

## 2020-12-18 ENCOUNTER — Encounter: Payer: Self-pay | Admitting: Physical Therapy

## 2020-12-18 DIAGNOSIS — M6281 Muscle weakness (generalized): Secondary | ICD-10-CM | POA: Diagnosis not present

## 2020-12-18 DIAGNOSIS — R293 Abnormal posture: Secondary | ICD-10-CM

## 2020-12-18 DIAGNOSIS — M542 Cervicalgia: Secondary | ICD-10-CM | POA: Diagnosis not present
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
## 2020-12-18 NOTE — Patient Instructions (Signed)
Access Code: [email or web address] URL: [email or web address] Date: 12/18/2020 Prepared by: Stacie Glaze  Exercises Supine Bridge with Resistance Band - 1 x daily - 7 x weekly - 3 sets - 10 reps - 3 hold Shoulder External Rotation and Scapular Retraction with Resistance - 1 x daily - 7 x weekly - 3 sets - 10 reps - 3 hold Scapular Retraction with Resistance - 1 x daily - 7 x weekly - 3 sets - 10 reps - 3 hold Scapular Retraction with Resistance Advanced - 1 x daily - 7 x weekly - 3 sets - 10 reps - 3 hold Standing Repeated Hip Extension with Resistance - 1 x daily - 7 x weekly - 3 sets - 10 reps - 3 hold Standing Hip Abduction with Theraband Resistance - 1 x daily - 7 x weekly - 3 sets - 10 reps - 3 hold Standing Bicep Curls with Resistance Band with PLB - 1 x daily - 7 x weekly - 3 sets - 10 reps - 3 hold Standing Elbow Extension with Self-Anchored Resistance - 1 x daily - 7 x weekly - 3 sets - 10 reps - 3 hold

## 2020-12-19 ENCOUNTER — Encounter (HOSPITAL_COMMUNITY): Payer: Self-pay

## 2020-12-19 ENCOUNTER — Ambulatory Visit (HOSPITAL_COMMUNITY): Payer: Medicare Other | Admitting: *Deleted

## 2020-12-19 VITALS — BP 125/94 | HR 81 | Ht 72.0 in | Wt 158.0 lb

## 2020-12-19 DIAGNOSIS — F3181 Bipolar II disorder: Secondary | ICD-10-CM

## 2020-12-19 NOTE — Progress Notes (Signed)
In early today for his shot because this is when his son could bring him. He is here for his monthly injection of Abilify M 400 mg and he brings his shot as he has medicaid. He informed us today he is first on the list for a senior apartment and he is waiting on the phone call, he believes he will have his own place this month. Today he got his shot in his L DELTOID without difficulty. He is to return in one month for his next injection.

## 2020-12-23 ENCOUNTER — Encounter (HOSPITAL_COMMUNITY): Payer: Self-pay

## 2020-12-23 ENCOUNTER — Ambulatory Visit (HOSPITAL_COMMUNITY): Payer: Medicare Other

## 2020-12-23 ENCOUNTER — Telehealth (HOSPITAL_COMMUNITY): Payer: Medicare Other

## 2020-12-23 ENCOUNTER — Ambulatory Visit: Payer: Medicare Other | Admitting: Physical Therapy

## 2020-12-23 ENCOUNTER — Other Ambulatory Visit: Payer: Self-pay

## 2020-12-25 ENCOUNTER — Ambulatory Visit: Payer: Medicare Other | Admitting: Physical Therapy

## 2020-12-30 ENCOUNTER — Telehealth (HOSPITAL_COMMUNITY): Payer: Self-pay | Admitting: *Deleted

## 2020-12-30 ENCOUNTER — Other Ambulatory Visit (HOSPITAL_COMMUNITY): Payer: Self-pay | Admitting: Family

## 2020-12-30 ENCOUNTER — Ambulatory Visit: Payer: Medicare Other | Admitting: Physical Therapy

## 2020-12-30 DIAGNOSIS — F5105 Insomnia due to other mental disorder: Secondary | ICD-10-CM

## 2020-12-30 DIAGNOSIS — F3181 Bipolar II disorder: Secondary | ICD-10-CM

## 2020-12-30 DIAGNOSIS — F411 Generalized anxiety disorder: Secondary | ICD-10-CM

## 2020-12-30 NOTE — Telephone Encounter (Signed)
Meds were refilled

## 2021-01-06 MED ORDER — BENZTROPINE MESYLATE 0.5 MG PO TABS: 0.5000 mg | ORAL_TABLET | Freq: Two times a day (BID) | ORAL | 2 refills | Status: DC

## 2021-01-06 MED ORDER — FLUOXETINE HCL 20 MG PO CAPS: 20.0000 mg | ORAL_CAPSULE | Freq: Every day | ORAL | 2 refills | Status: DC

## 2021-01-06 MED ORDER — HYDROXYZINE HCL 10 MG PO TABS: 10.0000 mg | ORAL_TABLET | Freq: Three times a day (TID) | ORAL | 2 refills | Status: DC

## 2021-01-06 MED ORDER — ZOLPIDEM TARTRATE ER 12.5 MG PO TBCR: 12.5000 mg | EXTENDED_RELEASE_TABLET | Freq: Every evening | ORAL | 1 refills | Status: DC | PRN

## 2021-01-08 ENCOUNTER — Ambulatory Visit: Payer: Medicare Other | Admitting: Physical Therapy

## 2021-01-09 ENCOUNTER — Other Ambulatory Visit: Payer: Self-pay

## 2021-01-09 ENCOUNTER — Ambulatory Visit (INDEPENDENT_AMBULATORY_CARE_PROVIDER_SITE_OTHER): Payer: Medicare Other | Admitting: Family

## 2021-01-09 ENCOUNTER — Encounter: Payer: Self-pay | Admitting: Family

## 2021-01-09 VITALS — BP 140/80 | HR 90 | Temp 96.9°F | Ht 72.0 in | Wt 161.0 lb

## 2021-01-09 DIAGNOSIS — I5032 Chronic diastolic (congestive) heart failure: Secondary | ICD-10-CM | POA: Diagnosis not present

## 2021-01-09 DIAGNOSIS — S335XXA Sprain of ligaments of lumbar spine, initial encounter: Secondary | ICD-10-CM

## 2021-01-09 MED ORDER — METOPROLOL TARTRATE 37.5 MG PO TABS: 1.0000 | ORAL_TABLET | Freq: Two times a day (BID) | ORAL | 3 refills | Status: DC

## 2021-01-09 MED ORDER — METHOCARBAMOL 500 MG PO TABS: 500.0000 mg | ORAL_TABLET | Freq: Four times a day (QID) | ORAL | 0 refills | Status: AC | PRN
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
## 2021-01-09 NOTE — Patient Instructions (Signed)
-   Take Extra strength Tylenol 500 mg tablet one by mouth every 6 hrs as needed for pain   -  Robaxin 500 mg tablet every 6 hrs as needed for pain   - use warm compressor or heating pad over a towel for 10-20 minutes daily as needed to relief pain

## 2021-01-13 ENCOUNTER — Ambulatory Visit: Payer: Medicare Other | Admitting: Physical Therapy

## 2021-01-15 ENCOUNTER — Encounter (HOSPITAL_COMMUNITY): Payer: Self-pay | Admitting: Registered Nurse

## 2021-01-15 ENCOUNTER — Encounter (HOSPITAL_COMMUNITY): Payer: Self-pay

## 2021-01-15 ENCOUNTER — Ambulatory Visit (INDEPENDENT_AMBULATORY_CARE_PROVIDER_SITE_OTHER): Payer: Medicare Other | Admitting: Registered Nurse

## 2021-01-15 ENCOUNTER — Ambulatory Visit (HOSPITAL_COMMUNITY): Payer: Medicare Other | Admitting: *Deleted

## 2021-01-15 ENCOUNTER — Other Ambulatory Visit: Payer: Self-pay

## 2021-01-15 VITALS — BP 147/91 | HR 80 | Ht 72.0 in | Wt 161.0 lb

## 2021-01-15 VITALS — BP 147/91 | HR 80 | Wt 161.0 lb

## 2021-01-15 DIAGNOSIS — F99 Mental disorder, not otherwise specified: Secondary | ICD-10-CM | POA: Diagnosis not present

## 2021-01-15 DIAGNOSIS — F411 Generalized anxiety disorder: Secondary | ICD-10-CM

## 2021-01-15 DIAGNOSIS — F3181 Bipolar II disorder: Secondary | ICD-10-CM

## 2021-01-15 DIAGNOSIS — F5105 Insomnia due to other mental disorder: Secondary | ICD-10-CM

## 2021-01-15 MED ORDER — FLUOXETINE HCL 10 MG PO CAPS: 30.0000 mg | ORAL_CAPSULE | Freq: Every day | ORAL | 2 refills | Status: DC

## 2021-01-15 NOTE — Patient Instructions (Addendum)
  Safety Plan Todd Zavala will reach out to to hie oldest soon that he lives with, call 911 or call mobile crisis, or go to the nearest emergency room if condition worsens or if suicidal thoughts become active Patients' will follow up with [facility] (current psychiatric provider) for outpatient psychiatric services (therapy/medication management).  The suicide prevention education provided includes the following: Suicide risk factors Suicide prevention and interventions National Suicide Hotline telephone number [facility] assessment telephone number [facility] Emergency Assistance 911 [facility] and/or Residential Mobile Crisis Unit telephone number Request made to patient Remove weapons (e.g., guns, rifles, knives), all items previously/currently identified as safety concern.   Remove drugs/medications (over the counter, prescriptions, illicit drugs), all items previously/currently identified as a safety concern.

## 2021-01-15 NOTE — Progress Notes (Signed)
In early as he often is today for his shot but had to wait till NP available to see him for his three month assessment. He was given his Abilify M 400 mg injection in his R DELTOID without difficulty. He reported to the NP he was depressed and for that reason his Prozac was increased. He denied being SI. He is still waiting on the call to tell him his apartment is ready, he expects it any day and is a bit frustrated with the wait and inability to plan. He will return in one month for his next injection.
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
## 2021-01-16 ENCOUNTER — Other Ambulatory Visit (HOSPITAL_COMMUNITY): Payer: Self-pay | Admitting: *Deleted

## 2021-01-16 ENCOUNTER — Ambulatory Visit (HOSPITAL_COMMUNITY): Payer: Medicare Other

## 2021-01-16 MED ORDER — FLUOXETINE HCL 10 MG PO CAPS: 30.0000 mg | ORAL_CAPSULE | Freq: Every day | ORAL | 0 refills | Status: DC

## 2021-01-22 ENCOUNTER — Ambulatory Visit: Payer: Medicare Other | Admitting: Physical Therapy

## 2021-01-22 MED ORDER — FLUOXETINE HCL 20 MG PO CAPS
20.0000 mg | ORAL_CAPSULE | Freq: Every day | ORAL | 2 refills | Status: DC
Start: 2021-01-22 — End: 2021-04-22

## 2021-01-22 MED ORDER — FLUOXETINE HCL 10 MG PO CAPS: 10.0000 mg | ORAL_CAPSULE | Freq: Every day | ORAL | 2 refills | Status: DC

## 2021-01-22 NOTE — Addendum Note (Signed)
Addended by: Assunta Found B on: 01/22/2021 12:47 PM   Modules accepted: Orders

## 2021-02-12 ENCOUNTER — Ambulatory Visit (HOSPITAL_COMMUNITY): Payer: Medicare Other

## 2021-02-17 ENCOUNTER — Ambulatory Visit (HOSPITAL_COMMUNITY): Payer: Medicare Other

## 2021-02-19 ENCOUNTER — Other Ambulatory Visit: Payer: Self-pay

## 2021-02-19 ENCOUNTER — Ambulatory Visit (HOSPITAL_COMMUNITY): Payer: Medicare Other | Admitting: *Deleted

## 2021-02-19 ENCOUNTER — Encounter (HOSPITAL_COMMUNITY): Payer: Self-pay

## 2021-02-19 VITALS — BP 139/85 | HR 76 | Ht 72.0 in | Wt 161.0 lb

## 2021-02-19 DIAGNOSIS — F3181 Bipolar II disorder: Secondary | ICD-10-CM

## 2021-02-19 NOTE — Progress Notes (Signed)
In as scheduled a week late for his shot, missed last week and was rescheduled for today. He is in good spirits today, looks nice and neat. He states he will be doing a walk thru in his new apartment this fri but doesn't have a move in date but is excited about the move. He states it has been years since he has lived alone in his own place. Today he got his shot in his L DELTOID which he gets Abilify M 400 mg. He is to return in one month for his next injection.

## 2021-02-23 ENCOUNTER — Other Ambulatory Visit: Payer: Self-pay | Admitting: Family

## 2021-02-23 DIAGNOSIS — S335XXA Sprain of ligaments of lumbar spine, initial encounter: Secondary | ICD-10-CM

## 2021-02-23 NOTE — Telephone Encounter (Signed)
Patient has request refill on medication "Robaxin". This medication isnt listed on patient medication list. Patient medication pend and sent to Abbey Chatters, NP due to PCP Hyacinth Meeker Bertram Millard, MD being out of office. Please Advise.

## 2021-03-19 ENCOUNTER — Other Ambulatory Visit: Payer: Self-pay

## 2021-03-19 ENCOUNTER — Ambulatory Visit (HOSPITAL_COMMUNITY): Payer: Medicare Other | Admitting: *Deleted

## 2021-03-19 ENCOUNTER — Encounter (HOSPITAL_COMMUNITY): Payer: Self-pay

## 2021-03-19 VITALS — BP 154/84 | HR 64 | Ht 72.0 in | Wt 159.0 lb

## 2021-03-19 DIAGNOSIS — F3181 Bipolar II disorder: Secondary | ICD-10-CM

## 2021-03-19 NOTE — Progress Notes (Signed)
In as scheduled for his monthly injection of Abilify M 400 mg given this am in his R DELTOID without an issue. He reports today he has his new apartment and hopes to be moving in in the early new year. He and his son where able to do a walk thru and his son has ordered him some furniture for it and waiting on his furniture for him to move in. His attitude today is up beat, he is appropriate and verbal and is feeling good about his impending move to more independence. He is at his baseline. He is to see the provider next visit for three month assessment as well as get his next injection.

## 2021-04-16 ENCOUNTER — Ambulatory Visit (HOSPITAL_COMMUNITY): Payer: Medicare Other

## 2021-04-16 ENCOUNTER — Ambulatory Visit (HOSPITAL_COMMUNITY): Payer: Self-pay

## 2021-04-16 ENCOUNTER — Other Ambulatory Visit: Payer: Self-pay

## 2021-04-16 ENCOUNTER — Encounter (HOSPITAL_COMMUNITY): Payer: Self-pay

## 2021-04-16 ENCOUNTER — Ambulatory Visit (HOSPITAL_COMMUNITY): Payer: Medicare Other | Admitting: *Deleted

## 2021-04-16 VITALS — BP 148/79 | HR 86 | Ht 72.0 in | Wt 170.0 lb

## 2021-04-16 DIAGNOSIS — F3181 Bipolar II disorder: Secondary | ICD-10-CM

## 2021-04-16 NOTE — Progress Notes (Signed)
In earlier today than scheduled. He was to see a provider today before getting his monthly injection of Abilijfy M 400 mg as is required he is to be seen every three months by a provider here for his status check. He states due to his transportation he has to come in the am. Discussed clearly with him I would not give him another injection of his Abilify till he sees one of our providers. Will schedule him on Tues in am when [facility] NP is here for him to be seen. I verbalized his understanding. Discussed his new living situation in his own apartment. He described it as cozy and he likes it. His family got him some furniture and he says he is doing well there. He got his shot today in his L DELTOID without difficulty. He is to return ion one month for his assessment with the provider and his injeciton.

## 2021-04-22 ENCOUNTER — Ambulatory Visit (INDEPENDENT_AMBULATORY_CARE_PROVIDER_SITE_OTHER): Payer: Medicare Other | Admitting: Family Medicine

## 2021-04-22 ENCOUNTER — Encounter: Payer: Self-pay | Admitting: Family Medicine

## 2021-04-22 ENCOUNTER — Other Ambulatory Visit: Payer: Self-pay

## 2021-04-22 VITALS — BP 130/80 | HR 80 | Temp 97.7°F | Ht 72.0 in | Wt 171.4 lb

## 2021-04-22 DIAGNOSIS — E782 Mixed hyperlipidemia: Secondary | ICD-10-CM | POA: Diagnosis not present

## 2021-04-22 DIAGNOSIS — I5032 Chronic diastolic (congestive) heart failure: Secondary | ICD-10-CM | POA: Diagnosis not present

## 2021-04-22 DIAGNOSIS — F3181 Bipolar II disorder: Secondary | ICD-10-CM | POA: Diagnosis not present

## 2021-04-22 DIAGNOSIS — R600 Localized edema: Secondary | ICD-10-CM

## 2021-04-22 LAB — COMPLETE METABOLIC PANEL WITHOUT GFR
AG Ratio: 1.1 (calc) (ref 1.0–2.5)
ALT: 32 U/L (ref 9–46)
AST: 32 U/L (ref 10–35)
Albumin: 3.9 g/dL (ref 3.6–5.1)
Alkaline phosphatase (APISO): 72 U/L (ref 35–144)
BUN: 12 mg/dL (ref 7–25)
CO2: 33 mmol/L — ABNORMAL HIGH (ref 20–32)
Calcium: 9.7 mg/dL (ref 8.6–10.3)
Chloride: 102 mmol/L (ref 98–110)
Creat: 0.95 mg/dL (ref 0.70–1.35)
Globulin: 3.5 g/dL (ref 1.9–3.7)
Glucose, Bld: 94 mg/dL (ref 65–99)
Potassium: 4.2 mmol/L (ref 3.5–5.3)
Sodium: 138 mmol/L (ref 135–146)
Total Bilirubin: 0.4 mg/dL (ref 0.2–1.2)
Total Protein: 7.4 g/dL (ref 6.1–8.1)
eGFR: 88 mL/min/1.73m2 (ref >=60)

## 2021-04-22 LAB — LIPID PANEL
Cholesterol: 188 mg/dL (ref <200)
HDL: 80 mg/dL (ref >=40)
LDL Cholesterol (Calc): 89 mg/dL (calc)
Non-HDL Cholesterol (Calc): 108 mg/dL (calc) (ref <130)
Total CHOL/HDL Ratio: 2.4 (calc) (ref <5.0)
Triglycerides: 101 mg/dL (ref <150)

## 2021-04-22 NOTE — Patient Instructions (Signed)
Please continue all your meds, even if you are feeling well
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
## 2021-05-04 ENCOUNTER — Other Ambulatory Visit: Payer: Self-pay | Admitting: *Deleted

## 2021-05-04 DIAGNOSIS — R600 Localized edema: Secondary | ICD-10-CM

## 2021-05-04 MED ORDER — FUROSEMIDE 20 MG PO TABS: 20.0000 mg | ORAL_TABLET | Freq: Every day | ORAL | 5 refills | Status: DC

## 2021-05-04 NOTE — Telephone Encounter (Signed)
Derrick, son called requesting refill for patient's Furosemide.

## 2021-05-19 ENCOUNTER — Ambulatory Visit (HOSPITAL_COMMUNITY): Payer: Medicare Other | Admitting: *Deleted

## 2021-05-19 ENCOUNTER — Other Ambulatory Visit: Payer: Self-pay

## 2021-05-19 ENCOUNTER — Encounter (HOSPITAL_COMMUNITY): Payer: Self-pay

## 2021-05-19 ENCOUNTER — Ambulatory Visit (HOSPITAL_COMMUNITY): Payer: Medicare Other

## 2021-05-19 VITALS — BP 133/77 | HR 76 | Ht 72.0 in | Wt 169.0 lb

## 2021-05-19 DIAGNOSIS — F3181 Bipolar II disorder: Secondary | ICD-10-CM

## 2021-05-19 NOTE — Progress Notes (Signed)
In as planned for his injection, he was also to see  provider but it was overlooked by Probation officer, will schedule him for his next appt. He looks good, offers no complaints, states he continues to settle in to his own place and is enjoying it more and more. He got his Abilify M 400 mg injection in his R DELTOID without issue. He is to return in  one month for his next shot.

## 2021-06-04 ENCOUNTER — Telehealth: Payer: Self-pay | Admitting: *Deleted

## 2021-06-04 NOTE — Telephone Encounter (Signed)
Bradley Ferris [phone] dropped off forms for patient. [facility]  ([facility] for Home and community based services.  ? ?Placed form in Dr. Rondel Baton folder to review, fill out and sign. Medication list attached.  ?To call Son, Ladene Artist once ready for pick up.  ?

## 2021-06-09 NOTE — Telephone Encounter (Signed)
Paperwork completed and faxed back to [facility] Fax:[phone]  ?[phone] ?Derrick, Son, Notified and agreed.  ?Copy sent to Scanning.  ?

## 2021-06-16 ENCOUNTER — Encounter (HOSPITAL_COMMUNITY): Payer: Self-pay | Admitting: Family

## 2021-06-16 ENCOUNTER — Ambulatory Visit (HOSPITAL_COMMUNITY): Payer: Medicare Other | Admitting: *Deleted

## 2021-06-16 ENCOUNTER — Ambulatory Visit (INDEPENDENT_AMBULATORY_CARE_PROVIDER_SITE_OTHER): Payer: Medicare Other | Admitting: Family

## 2021-06-16 ENCOUNTER — Other Ambulatory Visit: Payer: Self-pay

## 2021-06-16 ENCOUNTER — Encounter (HOSPITAL_COMMUNITY): Payer: Self-pay

## 2021-06-16 VITALS — BP 137/84 | HR 95 | Ht 72.0 in | Wt 174.0 lb

## 2021-06-16 DIAGNOSIS — F331 Major depressive disorder, recurrent, moderate: Secondary | ICD-10-CM | POA: Diagnosis not present

## 2021-06-16 DIAGNOSIS — F3181 Bipolar II disorder: Secondary | ICD-10-CM | POA: Diagnosis not present

## 2021-06-16 MED ORDER — ARIPIPRAZOLE ER 400 MG IM PRSY
400.0000 mg | PREFILLED_SYRINGE | INTRAMUSCULAR | Status: AC
  Administered 2021-07-14 – 2021-09-10 (×3): 400 mg via INTRAMUSCULAR

## 2021-06-16 MED ORDER — ABILIFY MAINTENA 400 MG IM PRSY: 400.0000 mg | PREFILLED_SYRINGE | INTRAMUSCULAR | 3 refills | Status: DC

## 2021-06-16 MED ORDER — FLUOXETINE HCL 20 MG PO CAPS: 20.0000 mg | ORAL_CAPSULE | Freq: Every day | ORAL | 2 refills | Status: DC
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
## 2021-06-16 NOTE — Progress Notes (Signed)
In as scheduled for his monthly injection of Abilify M 400 mg, given today in his R DELTOID at his request. He has a runny nose today, believes he is getting a cold. No changes or complaints offered today when asked. He seems to be leaning more to his L side today, when asked he thought it had something to do with the fluid in his body. He has gained weight since last month. He needs to see the Dr today for his q 3 month assessment. To return in one month for his next shot.  ?

## 2021-07-14 ENCOUNTER — Encounter (HOSPITAL_COMMUNITY): Payer: Self-pay

## 2021-07-14 ENCOUNTER — Ambulatory Visit (INDEPENDENT_AMBULATORY_CARE_PROVIDER_SITE_OTHER): Payer: Medicare Other | Admitting: *Deleted

## 2021-07-14 VITALS — BP 153/88 | HR 82 | Ht 72.0 in | Wt 161.0 lb

## 2021-07-14 DIAGNOSIS — F3181 Bipolar II disorder: Secondary | ICD-10-CM

## 2021-07-14 NOTE — Progress Notes (Signed)
Patient arrived for ARIPiprazole ER (ABILIFY MAINTENA) 400 MG . Tolerated injection well in Left-Arm  & Pleasant as Always ?

## 2021-08-11 ENCOUNTER — Ambulatory Visit (INDEPENDENT_AMBULATORY_CARE_PROVIDER_SITE_OTHER): Payer: Medicare Other | Admitting: *Deleted

## 2021-08-11 ENCOUNTER — Encounter (HOSPITAL_COMMUNITY): Payer: Self-pay

## 2021-08-11 VITALS — BP 147/81 | HR 74 | Ht 72.0 in | Wt 149.8 lb

## 2021-08-11 DIAGNOSIS — F3181 Bipolar II disorder: Secondary | ICD-10-CM | POA: Diagnosis not present

## 2021-08-11 NOTE — Progress Notes (Signed)
In for scheduled injection of his Abilify M 400 mg, given today in his R DELTOID without issue. He is ambulating well and has a nice personal appearance, but he is flat and not spontaneous and when writer questioned him he was very minimal with his answers. He denies being depressed or having psychotic sx. Denies any family issues.He has lost weight but states he has food in the house. Scheduled back in one month for his shot and will also need to see a provider that day too.  ?

## 2021-09-08 ENCOUNTER — Ambulatory Visit (HOSPITAL_COMMUNITY): Payer: Medicare Other

## 2021-09-10 ENCOUNTER — Encounter (HOSPITAL_COMMUNITY): Payer: Self-pay | Admitting: Registered Nurse

## 2021-09-10 ENCOUNTER — Ambulatory Visit (HOSPITAL_COMMUNITY): Payer: Medicare Other | Admitting: *Deleted

## 2021-09-10 ENCOUNTER — Ambulatory Visit (INDEPENDENT_AMBULATORY_CARE_PROVIDER_SITE_OTHER): Payer: Medicare Other | Admitting: Registered Nurse

## 2021-09-10 VITALS — BP 145/74 | HR 84 | Ht 72.0 in | Wt 144.0 lb

## 2021-09-10 DIAGNOSIS — F3181 Bipolar II disorder: Secondary | ICD-10-CM

## 2021-09-10 DIAGNOSIS — F331 Major depressive disorder, recurrent, moderate: Secondary | ICD-10-CM | POA: Diagnosis not present

## 2021-09-10 DIAGNOSIS — F5105 Insomnia due to other mental disorder: Secondary | ICD-10-CM | POA: Diagnosis not present

## 2021-09-10 DIAGNOSIS — F99 Mental disorder, not otherwise specified: Secondary | ICD-10-CM

## 2021-09-10 MED ORDER — ABILIFY MAINTENA 400 MG IM PRSY: 400.0000 mg | PREFILLED_SYRINGE | INTRAMUSCULAR | 2 refills | Status: DC

## 2021-09-10 MED ORDER — FLUOXETINE HCL 20 MG PO CAPS: 20.0000 mg | ORAL_CAPSULE | Freq: Every day | ORAL | 2 refills | Status: DC

## 2021-09-10 MED ORDER — HYDROXYZINE HCL 10 MG PO TABS: 10.0000 mg | ORAL_TABLET | Freq: Three times a day (TID) | ORAL | 2 refills | Status: DC

## 2021-09-10 MED ORDER — BENZTROPINE MESYLATE 0.5 MG PO TABS: 0.5000 mg | ORAL_TABLET | Freq: Two times a day (BID) | ORAL | 2 refills | Status: DC
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
## 2021-09-10 NOTE — Progress Notes (Unsigned)
In for his monthly injection of Abilify M 400 mg given today in his L DELTOID. He has a flat affect, offers little even to direct questions. Initially when asked he said he was fine, he doesn't look fine, and pressed it and said he was having stomach issues, has had them for a few days and has not seen a dr. Michela Pitcher he would see a dr if it continues. He does have a smell of feces or gas suggesting he does have stomach issue. He is to return in one month. Called his [facility] transportation to come back for him.

## 2021-10-13 ENCOUNTER — Ambulatory Visit (INDEPENDENT_AMBULATORY_CARE_PROVIDER_SITE_OTHER): Payer: Medicare Other | Admitting: *Deleted

## 2021-10-13 ENCOUNTER — Encounter (HOSPITAL_COMMUNITY): Payer: Self-pay

## 2021-10-13 VITALS — BP 138/78 | Ht 72.0 in | Wt 136.0 lb

## 2021-10-13 DIAGNOSIS — F3181 Bipolar II disorder: Secondary | ICD-10-CM | POA: Diagnosis not present

## 2021-10-13 MED ORDER — ARIPIPRAZOLE ER 400 MG IM PRSY
400.0000 mg | PREFILLED_SYRINGE | Freq: Once | INTRAMUSCULAR | Status: AC
  Administered 2021-10-13: 400 mg via INTRAMUSCULAR

## 2021-10-13 NOTE — Progress Notes (Signed)
In as scheduled for monthly injection of Abilify M 400 mg given today in his R DELTOID. He is losing weight, smells of feces and is flat and not spontaneous. He denies vomiting or loose stools. He states he has food at home and is eating, not trying to lose weight. He is appropriate, son brought him today and is not complaining of any psych sx. He has an appt with his PCP on 10/23/21. Will return in 28 days for his next shot.

## 2021-10-21 ENCOUNTER — Ambulatory Visit: Payer: Medicare Other | Admitting: Family Medicine

## 2021-10-23 ENCOUNTER — Encounter: Payer: Self-pay | Admitting: Family Medicine

## 2021-10-23 ENCOUNTER — Ambulatory Visit (INDEPENDENT_AMBULATORY_CARE_PROVIDER_SITE_OTHER): Payer: Medicare Other | Admitting: Family Medicine

## 2021-10-23 VITALS — BP 142/98 | HR 85 | Temp 96.8°F | Resp 0 | Ht 72.0 in | Wt 133.8 lb

## 2021-10-23 DIAGNOSIS — Z5900 Homelessness unspecified: Secondary | ICD-10-CM

## 2021-10-23 DIAGNOSIS — R600 Localized edema: Secondary | ICD-10-CM

## 2021-10-23 DIAGNOSIS — E782 Mixed hyperlipidemia: Secondary | ICD-10-CM

## 2021-10-23 DIAGNOSIS — F3181 Bipolar II disorder: Secondary | ICD-10-CM

## 2021-10-23 NOTE — Patient Instructions (Signed)
You may try taking furosemide every other day but if swelling becomes worse he will have to continue taking it every day Try some Ensure or boost to supplement her calories to maintain her weight
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
## 2021-11-10 ENCOUNTER — Ambulatory Visit (HOSPITAL_COMMUNITY): Payer: Medicare Other | Admitting: *Deleted

## 2021-11-10 ENCOUNTER — Encounter (HOSPITAL_COMMUNITY): Payer: Self-pay

## 2021-11-10 VITALS — BP 141/71 | HR 76 | Ht 72.0 in | Wt 135.0 lb

## 2021-11-10 DIAGNOSIS — F3181 Bipolar II disorder: Secondary | ICD-10-CM

## 2021-11-10 MED ORDER — ARIPIPRAZOLE ER 400 MG IM PRSY
400.0000 mg | PREFILLED_SYRINGE | INTRAMUSCULAR | Status: AC
  Administered 2021-11-10 – 2022-01-07 (×3): 400 mg via INTRAMUSCULAR

## 2021-11-10 NOTE — Progress Notes (Signed)
In for his monthly injection of Abilify M 400 mg given today in his L DELTOID without issue. He states he saw his PCP several weeks ago and he told him he needed to gain weight. He is minimal with Clinical research associate, only answering what is asked of him in short answers. States he has two bad teeth which is the reason he is not eating and losing weight. When I weighed him today he states he gained 3 lbs since seeing the dr. He states he has an appt with a DDS but its along way out and he is trying to find something sooner. He is to return in one month for his next appt.

## 2021-12-08 ENCOUNTER — Encounter (HOSPITAL_COMMUNITY): Payer: Self-pay | Admitting: Registered Nurse

## 2021-12-08 ENCOUNTER — Encounter (HOSPITAL_COMMUNITY): Payer: Self-pay

## 2021-12-08 ENCOUNTER — Ambulatory Visit (INDEPENDENT_AMBULATORY_CARE_PROVIDER_SITE_OTHER): Payer: Medicare Other | Admitting: Registered Nurse

## 2021-12-08 ENCOUNTER — Ambulatory Visit (HOSPITAL_COMMUNITY): Payer: Medicare Other

## 2021-12-08 VITALS — BP 143/78 | HR 68 | Ht 72.0 in | Wt 131.0 lb

## 2021-12-08 DIAGNOSIS — F411 Generalized anxiety disorder: Secondary | ICD-10-CM | POA: Diagnosis not present

## 2021-12-08 DIAGNOSIS — F3181 Bipolar II disorder: Secondary | ICD-10-CM | POA: Diagnosis not present

## 2021-12-08 DIAGNOSIS — F99 Mental disorder, not otherwise specified: Secondary | ICD-10-CM

## 2021-12-08 DIAGNOSIS — F5105 Insomnia due to other mental disorder: Secondary | ICD-10-CM | POA: Diagnosis not present

## 2021-12-08 DIAGNOSIS — F331 Major depressive disorder, recurrent, moderate: Secondary | ICD-10-CM

## 2021-12-08 MED ORDER — FLUOXETINE HCL 20 MG PO CAPS: 20.0000 mg | ORAL_CAPSULE | Freq: Every day | ORAL | 2 refills | Status: DC

## 2021-12-08 MED ORDER — BENZTROPINE MESYLATE 0.5 MG PO TABS: 0.5000 mg | ORAL_TABLET | Freq: Two times a day (BID) | ORAL | 2 refills | Status: DC

## 2021-12-08 MED ORDER — ABILIFY MAINTENA 400 MG IM PRSY: 400.0000 mg | PREFILLED_SYRINGE | INTRAMUSCULAR | 2 refills | Status: DC

## 2021-12-08 MED ORDER — HYDROXYZINE HCL 10 MG PO TABS: 10.0000 mg | ORAL_TABLET | Freq: Three times a day (TID) | ORAL | 2 refills | Status: DC

## 2021-12-08 NOTE — Progress Notes (Signed)
Patient arrived for ARIPiprazole ER (ABILIFY MAINTENA) 400 MG . Tolerated injection well in Right-Arm  & Pleasant as Always
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
## 2022-01-07 ENCOUNTER — Encounter (HOSPITAL_COMMUNITY): Payer: Self-pay

## 2022-01-07 ENCOUNTER — Ambulatory Visit (INDEPENDENT_AMBULATORY_CARE_PROVIDER_SITE_OTHER): Payer: Medicare Other | Admitting: *Deleted

## 2022-01-07 VITALS — BP 128/77 | HR 73 | Ht 72.0 in | Wt 128.0 lb

## 2022-01-07 DIAGNOSIS — F3181 Bipolar II disorder: Secondary | ICD-10-CM | POA: Diagnosis not present

## 2022-01-07 MED ORDER — ARIPIPRAZOLE ER 400 MG IM PRSY
400.0000 mg | PREFILLED_SYRINGE | Freq: Once | INTRAMUSCULAR | Status: AC
  Administered 2022-02-04: 400 mg via INTRAMUSCULAR

## 2022-01-07 NOTE — Progress Notes (Signed)
In as scheduled for his Abilify M 400 mg inj. He got his shot in his L DELTOID today. He has lost weight. States he has a PCP appt in Jan and last time he saw the PCP he was told to add Ensure to his meals which he says he is doing. Denies any psychiatric concerns. Told him this am I would be retiring but his services would remain the same. He is to return in 28 days for his next shot.

## 2022-02-04 ENCOUNTER — Ambulatory Visit (HOSPITAL_COMMUNITY): Payer: Medicare Other

## 2022-02-04 ENCOUNTER — Encounter (HOSPITAL_COMMUNITY): Payer: Self-pay

## 2022-02-04 VITALS — BP 139/82 | HR 82 | Resp 12 | Ht 72.0 in | Wt 130.0 lb

## 2022-02-04 DIAGNOSIS — F331 Major depressive disorder, recurrent, moderate: Secondary | ICD-10-CM

## 2022-02-04 DIAGNOSIS — F5105 Insomnia due to other mental disorder: Secondary | ICD-10-CM

## 2022-02-04 DIAGNOSIS — F411 Generalized anxiety disorder: Secondary | ICD-10-CM

## 2022-02-04 DIAGNOSIS — F3181 Bipolar II disorder: Secondary | ICD-10-CM

## 2022-02-04 NOTE — Progress Notes (Signed)
  Patient arrived for ARIPiprazole ER (ABILIFY MAINTENA) 400 MG . Tolerated injection well in LEFT-Arm  & Pleasant as Always

## 2022-03-04 ENCOUNTER — Ambulatory Visit (INDEPENDENT_AMBULATORY_CARE_PROVIDER_SITE_OTHER): Payer: Medicare Other | Admitting: Psychiatry

## 2022-03-04 ENCOUNTER — Encounter (HOSPITAL_COMMUNITY): Payer: Self-pay

## 2022-03-04 ENCOUNTER — Ambulatory Visit (HOSPITAL_COMMUNITY): Payer: Medicare Other

## 2022-03-04 VITALS — BP 140/84 | HR 68 | Ht 72.0 in | Wt 128.2 lb

## 2022-03-04 DIAGNOSIS — F3181 Bipolar II disorder: Secondary | ICD-10-CM | POA: Diagnosis not present

## 2022-03-04 DIAGNOSIS — F5105 Insomnia due to other mental disorder: Secondary | ICD-10-CM

## 2022-03-04 DIAGNOSIS — F411 Generalized anxiety disorder: Secondary | ICD-10-CM

## 2022-03-04 DIAGNOSIS — F99 Mental disorder, not otherwise specified: Secondary | ICD-10-CM | POA: Diagnosis not present

## 2022-03-04 DIAGNOSIS — F331 Major depressive disorder, recurrent, moderate: Secondary | ICD-10-CM

## 2022-03-04 MED ORDER — ARIPIPRAZOLE ER 400 MG IM PRSY
400.0000 mg | PREFILLED_SYRINGE | Freq: Once | INTRAMUSCULAR | Status: AC
  Administered 2022-03-04: 400 mg via INTRAMUSCULAR

## 2022-03-04 MED ORDER — HYDROXYZINE HCL 10 MG PO TABS: 10.0000 mg | ORAL_TABLET | Freq: Three times a day (TID) | ORAL | 3 refills | Status: DC

## 2022-03-04 MED ORDER — BENZTROPINE MESYLATE 0.5 MG PO TABS: 0.5000 mg | ORAL_TABLET | Freq: Two times a day (BID) | ORAL | 3 refills | Status: DC

## 2022-03-04 MED ORDER — ABILIFY MAINTENA 400 MG IM PRSY: 400.0000 mg | PREFILLED_SYRINGE | INTRAMUSCULAR | 11 refills | Status: DC

## 2022-03-04 MED ORDER — ARIPIPRAZOLE ER 400 MG IM PRSY
400.0000 mg | PREFILLED_SYRINGE | Freq: Once | INTRAMUSCULAR | Status: AC
  Administered 2022-05-25: 400 mg via INTRAMUSCULAR

## 2022-03-04 MED ORDER — FLUOXETINE HCL 20 MG PO CAPS: 20.0000 mg | ORAL_CAPSULE | Freq: Every day | ORAL | 3 refills | Status: DC

## 2022-03-04 NOTE — Progress Notes (Signed)
  Patient arrived for ARIPiprazole ER (ABILIFY MAINTENA) 400 MG . Tolerated injection well in RIGHT Arm  & Pleasant as Always
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
## 2022-04-27 ENCOUNTER — Ambulatory Visit: Payer: Medicare Other | Admitting: Family Medicine

## 2022-04-28 ENCOUNTER — Ambulatory Visit (INDEPENDENT_AMBULATORY_CARE_PROVIDER_SITE_OTHER): Payer: Medicare Other | Admitting: Family Medicine

## 2022-04-28 ENCOUNTER — Encounter: Payer: Self-pay | Admitting: Family Medicine

## 2022-04-28 VITALS — BP 122/78 | HR 78 | Temp 97.3°F | Ht 72.0 in | Wt 118.4 lb

## 2022-04-28 DIAGNOSIS — N401 Enlarged prostate with lower urinary tract symptoms: Secondary | ICD-10-CM

## 2022-04-28 DIAGNOSIS — F3181 Bipolar II disorder: Secondary | ICD-10-CM

## 2022-04-28 DIAGNOSIS — R600 Localized edema: Secondary | ICD-10-CM | POA: Diagnosis not present

## 2022-04-28 DIAGNOSIS — R634 Abnormal weight loss: Secondary | ICD-10-CM | POA: Diagnosis not present

## 2022-04-28 DIAGNOSIS — R351 Nocturia: Secondary | ICD-10-CM | POA: Diagnosis not present

## 2022-04-28 MED ORDER — CITALOPRAM HYDROBROMIDE 10 MG PO TABS: 10.0000 mg | ORAL_TABLET | Freq: Every day | ORAL | 3 refills | Status: DC
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
## 2022-04-30 ENCOUNTER — Telehealth: Payer: Self-pay

## 2022-04-30 LAB — CBC
HCT: 43 % (ref 38.5–50.0)
Hemoglobin: 13.8 g/dL (ref 13.2–17.1)
MCH: 28.3 pg (ref 27.0–33.0)
MCHC: 32.1 g/dL (ref 32.0–36.0)
MCV: 88.1 fL (ref 80.0–100.0)
MPV: 11.1 fL (ref 7.5–12.5)
Platelets: 221 10*3/uL (ref 140–400)
RBC: 4.88 10*6/uL (ref 4.20–5.80)
RDW: 11.8 % (ref 11.0–15.0)
WBC: 4 10*3/uL (ref 3.8–10.8)

## 2022-04-30 LAB — PSA, TOTAL AND FREE
PSA, % Free: 33 % (calc) (ref >25)
PSA, Free: 0.7 ng/mL
PSA, Total: 2.1 ng/mL (ref <=4.0)

## 2022-04-30 LAB — TSH: TSH: 1.21 mIU/L (ref 0.40–4.50)

## 2022-04-30 LAB — COMPLETE METABOLIC PANEL WITH GFR
AG Ratio: 1.1 (calc) (ref 1.0–2.5)
ALT: 14 U/L (ref 9–46)
AST: 17 U/L (ref 10–35)
Albumin: 4 g/dL (ref 3.6–5.1)
Alkaline phosphatase (APISO): 78 U/L (ref 35–144)
BUN: 13 mg/dL (ref 7–25)
CO2: 25 mmol/L (ref 20–32)
Calcium: 9.5 mg/dL (ref 8.6–10.3)
Chloride: 103 mmol/L (ref 98–110)
Creat: 0.82 mg/dL (ref 0.70–1.35)
Globulin: 3.7 g/dL (calc) (ref 1.9–3.7)
Glucose, Bld: 77 mg/dL (ref 65–99)
Potassium: 4 mmol/L (ref 3.5–5.3)
Sodium: 141 mmol/L (ref 135–146)
Total Bilirubin: 0.4 mg/dL (ref 0.2–1.2)
Total Protein: 7.7 g/dL (ref 6.1–8.1)
eGFR: 96 mL/min/{1.73_m2} (ref >=60)

## 2022-04-30 NOTE — Telephone Encounter (Signed)
Patient's son,Derrick,called, with father on the phone to discuss recent lab results. Both patient and son verbalized their understanding. Still witing on PSA results.Once PSA results in we will give him a call.

## 2022-05-25 ENCOUNTER — Encounter (HOSPITAL_COMMUNITY): Payer: Self-pay

## 2022-05-25 ENCOUNTER — Ambulatory Visit (HOSPITAL_COMMUNITY): Payer: Medicare Other

## 2022-05-25 ENCOUNTER — Ambulatory Visit (INDEPENDENT_AMBULATORY_CARE_PROVIDER_SITE_OTHER): Payer: Medicare Other | Admitting: Psychiatry

## 2022-05-25 VITALS — BP 135/79 | HR 74 | Ht 72.0 in | Wt 123.4 lb

## 2022-05-25 DIAGNOSIS — F331 Major depressive disorder, recurrent, moderate: Secondary | ICD-10-CM

## 2022-05-25 DIAGNOSIS — F3181 Bipolar II disorder: Secondary | ICD-10-CM | POA: Diagnosis not present

## 2022-05-25 DIAGNOSIS — F411 Generalized anxiety disorder: Secondary | ICD-10-CM

## 2022-05-25 MED ORDER — ABILIFY MAINTENA 400 MG IM PRSY: 400.0000 mg | PREFILLED_SYRINGE | INTRAMUSCULAR | 11 refills | Status: DC

## 2022-05-25 NOTE — Progress Notes (Signed)
Patient arrived for ARIPiprazole ER (ABILIFY MAINTENA) 400 MG . Tolerated injection well in RIGHT Arm  & Pleasant as Always
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
## 2022-05-26 ENCOUNTER — Encounter (HOSPITAL_COMMUNITY): Payer: Medicare Other | Admitting: Psychiatry

## 2022-05-26 ENCOUNTER — Encounter: Payer: Self-pay | Admitting: Family Medicine

## 2022-05-26 ENCOUNTER — Ambulatory Visit (INDEPENDENT_AMBULATORY_CARE_PROVIDER_SITE_OTHER): Payer: Medicare Other | Admitting: Family Medicine

## 2022-05-26 VITALS — BP 124/82 | HR 63 | Temp 98.1°F | Ht 72.0 in | Wt 123.6 lb

## 2022-05-26 DIAGNOSIS — E785 Hyperlipidemia, unspecified: Secondary | ICD-10-CM

## 2022-05-26 DIAGNOSIS — R600 Localized edema: Secondary | ICD-10-CM | POA: Diagnosis not present

## 2022-05-26 DIAGNOSIS — F3181 Bipolar II disorder: Secondary | ICD-10-CM
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
## 2022-05-26 NOTE — Patient Instructions (Signed)
Schedule patient for AWV

## 2022-05-27 LAB — LIPID PANEL
Cholesterol: 243 mg/dL — ABNORMAL HIGH (ref <200)
HDL: 93 mg/dL (ref >=40)
LDL Cholesterol (Calc): 135 mg/dL (calc) — ABNORMAL HIGH
Non-HDL Cholesterol (Calc): 150 mg/dL (calc) — ABNORMAL HIGH (ref <130)
Total CHOL/HDL Ratio: 2.6 (calc) (ref <5.0)
Triglycerides: 55 mg/dL (ref <150)

## 2022-05-28 ENCOUNTER — Other Ambulatory Visit: Payer: Self-pay

## 2022-05-28 MED ORDER — ATORVASTATIN CALCIUM 40 MG PO TABS: 40.0000 mg | ORAL_TABLET | Freq: Every day | ORAL | 5 refills | Status: DC

## 2022-06-02 ENCOUNTER — Other Ambulatory Visit: Payer: Self-pay | Admitting: Family Medicine

## 2022-06-02 DIAGNOSIS — E785 Hyperlipidemia, unspecified: Secondary | ICD-10-CM

## 2022-06-02 MED ORDER — ATORVASTATIN CALCIUM 40 MG PO TABS: 40.0000 mg | ORAL_TABLET | Freq: Every day | ORAL | 5 refills | Status: AC

## 2022-06-22 ENCOUNTER — Encounter (HOSPITAL_COMMUNITY): Payer: Self-pay

## 2022-06-22 ENCOUNTER — Ambulatory Visit (INDEPENDENT_AMBULATORY_CARE_PROVIDER_SITE_OTHER): Payer: Medicare Other

## 2022-06-22 ENCOUNTER — Ambulatory Visit (HOSPITAL_COMMUNITY): Payer: Medicare Other

## 2022-06-22 VITALS — BP 124/82 | HR 63 | Ht 72.0 in | Wt 123.0 lb

## 2022-06-22 DIAGNOSIS — F411 Generalized anxiety disorder: Secondary | ICD-10-CM | POA: Diagnosis not present

## 2022-06-22 DIAGNOSIS — F5105 Insomnia due to other mental disorder: Secondary | ICD-10-CM

## 2022-06-22 DIAGNOSIS — F3181 Bipolar II disorder: Secondary | ICD-10-CM

## 2022-06-22 MED ORDER — ARIPIPRAZOLE ER 400 MG IM PRSY
400.0000 mg | PREFILLED_SYRINGE | Freq: Once | INTRAMUSCULAR | Status: AC
  Administered 2022-06-22: 400 mg via INTRAMUSCULAR

## 2022-06-22 NOTE — Progress Notes (Signed)
Patient arrived for ARIPiprazole ER (ABILIFY MAINTENA) 400 MG . Tolerated injection well in LEFTArm  & Pleasant as Always

## 2022-07-20 ENCOUNTER — Other Ambulatory Visit (HOSPITAL_COMMUNITY): Payer: Self-pay | Admitting: Psychiatry

## 2022-07-20 ENCOUNTER — Encounter (HOSPITAL_COMMUNITY): Payer: Self-pay

## 2022-07-20 ENCOUNTER — Ambulatory Visit (INDEPENDENT_AMBULATORY_CARE_PROVIDER_SITE_OTHER): Payer: Medicare Other

## 2022-07-20 VITALS — BP 136/80 | HR 71 | Ht 72.0 in | Wt 126.4 lb

## 2022-07-20 DIAGNOSIS — F411 Generalized anxiety disorder: Secondary | ICD-10-CM

## 2022-07-20 DIAGNOSIS — F99 Mental disorder, not otherwise specified: Secondary | ICD-10-CM

## 2022-07-20 DIAGNOSIS — F3181 Bipolar II disorder: Secondary | ICD-10-CM

## 2022-07-20 DIAGNOSIS — F5105 Insomnia due to other mental disorder: Secondary | ICD-10-CM

## 2022-07-20 MED ORDER — ARIPIPRAZOLE ER 400 MG IM PRSY
400.0000 mg | PREFILLED_SYRINGE | INTRAMUSCULAR | Status: AC
  Administered 2022-08-17 – 2023-01-04 (×5): 400 mg via INTRAMUSCULAR

## 2022-07-20 MED ORDER — ARIPIPRAZOLE ER 400 MG IM PRSY
400.0000 mg | PREFILLED_SYRINGE | Freq: Once | INTRAMUSCULAR | Status: AC
  Administered 2022-07-20: 400 mg via INTRAMUSCULAR

## 2022-07-20 NOTE — Progress Notes (Signed)
Patient arrived for ARIPiprazole ER (ABILIFY MAINTENA) 400 MG . Tolerated injection well in RIGHT Arm  & Pleasant as Always  

## 2022-08-13 ENCOUNTER — Other Ambulatory Visit: Payer: Self-pay | Admitting: Family Medicine

## 2022-08-13 DIAGNOSIS — F3181 Bipolar II disorder: Secondary | ICD-10-CM

## 2022-08-17 ENCOUNTER — Encounter (HOSPITAL_COMMUNITY): Payer: Self-pay

## 2022-08-17 ENCOUNTER — Ambulatory Visit (INDEPENDENT_AMBULATORY_CARE_PROVIDER_SITE_OTHER): Payer: Medicare Other | Admitting: Psychiatry

## 2022-08-17 ENCOUNTER — Ambulatory Visit (INDEPENDENT_AMBULATORY_CARE_PROVIDER_SITE_OTHER): Payer: Medicare Other

## 2022-08-17 VITALS — BP 134/84 | HR 68 | Ht 72.0 in | Wt 125.4 lb

## 2022-08-17 DIAGNOSIS — F331 Major depressive disorder, recurrent, moderate: Secondary | ICD-10-CM

## 2022-08-17 DIAGNOSIS — F5105 Insomnia due to other mental disorder: Secondary | ICD-10-CM

## 2022-08-17 DIAGNOSIS — F3181 Bipolar II disorder: Secondary | ICD-10-CM | POA: Diagnosis not present

## 2022-08-17 DIAGNOSIS — F200 Paranoid schizophrenia: Secondary | ICD-10-CM

## 2022-08-17 DIAGNOSIS — G4700 Insomnia, unspecified: Secondary | ICD-10-CM

## 2022-08-17 DIAGNOSIS — F99 Mental disorder, not otherwise specified: Secondary | ICD-10-CM

## 2022-08-17 MED ORDER — ABILIFY MAINTENA 400 MG IM PRSY: 400.0000 mg | PREFILLED_SYRINGE | INTRAMUSCULAR | 11 refills | Status: DC

## 2022-08-17 NOTE — Progress Notes (Signed)
  Patient arrived for ARIPiprazole ER (ABILIFY MAINTENA) 400 MG . Tolerated injection well in LEFT-Arm  & Pleasant as Always   
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
## 2022-08-24 ENCOUNTER — Ambulatory Visit (HOSPITAL_COMMUNITY): Payer: Medicare Other

## 2022-09-14 ENCOUNTER — Ambulatory Visit (INDEPENDENT_AMBULATORY_CARE_PROVIDER_SITE_OTHER): Payer: Medicare Other

## 2022-09-14 ENCOUNTER — Encounter (HOSPITAL_COMMUNITY): Payer: Self-pay

## 2022-09-14 VITALS — BP 134/74 | HR 64 | Resp 16 | Ht 72.0 in | Wt 124.4 lb

## 2022-09-14 DIAGNOSIS — F3181 Bipolar II disorder: Secondary | ICD-10-CM | POA: Diagnosis not present

## 2022-09-14 NOTE — Progress Notes (Cosign Needed)
Patient arrived for ARIPiprazole ER (ABILIFY MAINTENA) 400 MG . Tolerated injection well in RIGHT Arm  & Pleasant as Always  

## 2022-09-17 ENCOUNTER — Encounter: Payer: Self-pay | Admitting: Adult Health

## 2022-09-17 ENCOUNTER — Ambulatory Visit (INDEPENDENT_AMBULATORY_CARE_PROVIDER_SITE_OTHER): Payer: Medicare Other | Admitting: Adult Health

## 2022-09-17 VITALS — BP 127/78 | HR 60 | Temp 98.1°F | Resp 18 | Ht 72.0 in | Wt 124.4 lb

## 2022-09-17 DIAGNOSIS — Z0000 Encounter for general adult medical examination without abnormal findings: Secondary | ICD-10-CM | POA: Diagnosis not present

## 2022-09-17 DIAGNOSIS — Z1211 Encounter for screening for malignant neoplasm of colon: Secondary | ICD-10-CM
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
## 2022-09-17 NOTE — Patient Instructions (Addendum)
  Todd Zavala , Thank you for taking time to come for your Medicare Wellness Visit. I appreciate your ongoing commitment to your health goals. Please review the following plan we discussed and let me know if I can assist you in the future.   These are the goals we discussed:  Goals       Exercise 3x per week (30 min per time) (pt-stated)      - walk 30 mins walking 3X/week        This is a list of the screening recommended for you and due dates:  Health Maintenance  Topic Date Due   DTaP/Tdap/Td vaccine (1 - Tdap) Never done   Colon Cancer Screening  Never done   Zoster (Shingles) Vaccine (2 of 2) 12/13/2020   COVID-19 Vaccine (4 - 2023-24 season) 12/04/2021   Flu Shot  11/04/2022   Medicare Annual Wellness Visit  09/17/2023   Pneumonia Vaccine  Completed   Hepatitis C Screening  Completed   HPV Vaccine  Aged Out

## 2022-09-22 ENCOUNTER — Ambulatory Visit (INDEPENDENT_AMBULATORY_CARE_PROVIDER_SITE_OTHER): Payer: Medicare Other | Admitting: Family Medicine

## 2022-09-22 ENCOUNTER — Encounter: Payer: Self-pay | Admitting: Family Medicine

## 2022-09-22 VITALS — BP 122/84 | HR 64 | Temp 97.9°F | Ht 72.0 in | Wt 125.6 lb

## 2022-09-22 DIAGNOSIS — F3181 Bipolar II disorder: Secondary | ICD-10-CM | POA: Diagnosis not present

## 2022-09-22 DIAGNOSIS — E785 Hyperlipidemia, unspecified: Secondary | ICD-10-CM | POA: Diagnosis not present

## 2022-09-22 NOTE — Patient Instructions (Signed)
Call to schedule colonoscopy the number below: [facility] [address] [place]. [place], [place]  Main [place]: [phone]
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
## 2022-10-04 ENCOUNTER — Encounter: Payer: Self-pay | Admitting: Gastroenterology

## 2022-10-11 ENCOUNTER — Telehealth: Payer: Self-pay | Admitting: *Deleted

## 2022-10-11 NOTE — Telephone Encounter (Signed)
Attempt to reach pt for pre-visit. LM with call back #. Will attempt again same # in 5 minutes. Instructed pt that I will try again in 5 minutes but if do not reach him with that call he needs to call back and reschedule his pre-visit by the end of the day or the scheduled pre-visit and  procedure will be canceled per protocol. @nd  attempt to reach pt unsuccessful. LM with # to contact with instructions to call and reschedule the  pre-visit or procedure will be canceled at end of day.  RN will verify if pt has rescheduled pre-visit or not at end of day and if no per protocol Procedure will be canceled,

## 2022-10-12 ENCOUNTER — Encounter (HOSPITAL_COMMUNITY): Payer: Self-pay

## 2022-10-12 ENCOUNTER — Ambulatory Visit (HOSPITAL_COMMUNITY): Payer: Medicare Other

## 2022-10-12 ENCOUNTER — Ambulatory Visit (INDEPENDENT_AMBULATORY_CARE_PROVIDER_SITE_OTHER): Payer: Medicare Other | Admitting: Psychiatry

## 2022-10-12 DIAGNOSIS — F3181 Bipolar II disorder: Secondary | ICD-10-CM | POA: Diagnosis not present

## 2022-10-12 MED ORDER — ABILIFY MAINTENA 400 MG IM PRSY: 400.0000 mg | PREFILLED_SYRINGE | INTRAMUSCULAR | 11 refills | Status: DC
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
## 2022-10-12 NOTE — Progress Notes (Signed)
Patient in today for due Abilify Maintena 400 mg IM injection every 28 days.  Met with Todd Zavala after he was evaluated by Dr. Doyne Keel to continue his due injections.  Patient presented with flat affect, level and pleasant mood and denied any auditory or visual hallucinations, no suicidal or homicidal ideations, plan, intent or means to want to harm self or others at this time.  Patient's due Abilify Maintena 400 mg IM injection prepared as ordered and given to patient in his left deltoid area.  Patient tolerated due injection without complaint of pain or discomfort and agreed to return in 4 weeks for next due injection.  Transportation called for patient and patient rescheduled for next injection in 4 weeks.

## 2022-10-26 ENCOUNTER — Encounter: Payer: Medicare Other | Admitting: Gastroenterology

## 2022-11-09 ENCOUNTER — Ambulatory Visit (INDEPENDENT_AMBULATORY_CARE_PROVIDER_SITE_OTHER): Payer: Medicare Other

## 2022-11-09 ENCOUNTER — Encounter (HOSPITAL_COMMUNITY): Payer: Self-pay

## 2022-11-09 ENCOUNTER — Telehealth (HOSPITAL_COMMUNITY): Payer: Self-pay | Admitting: Psychiatry

## 2022-11-09 VITALS — BP 131/80 | HR 68 | Ht 72.0 in | Wt 118.2 lb

## 2022-11-09 DIAGNOSIS — F3181 Bipolar II disorder: Secondary | ICD-10-CM

## 2022-11-09 DIAGNOSIS — F200 Paranoid schizophrenia: Secondary | ICD-10-CM

## 2022-11-09 DIAGNOSIS — F411 Generalized anxiety disorder: Secondary | ICD-10-CM | POA: Diagnosis not present

## 2022-11-09 DIAGNOSIS — G4700 Insomnia, unspecified: Secondary | ICD-10-CM

## 2022-11-09 NOTE — Telephone Encounter (Signed)
Patient came into shot clinic today.  Nursing staff informed writer that since his last visit he is lost weight.  At patient's last visit he weighed 125 and now weighs 118.2. Patient notes that his appetite is poor. Nursing staff and provider reccommended protein shakes.  Provider also encouraged patient to follow-up with his PCP.  Provider ordered routine labs as patient has not had them in over 6 months.  Provider ordered CBC, CMP, lipid panel, HgbA1c, LFT, thyroid panel, and prolactin level.

## 2022-11-09 NOTE — Progress Notes (Signed)
Patient arrived for ARIPiprazole ER (ABILIFY MAINTENA) 400 MG . Tolerated injection well in RIGHT Arm  & Pleasant as

## 2022-11-23 ENCOUNTER — Telehealth: Payer: Self-pay | Admitting: *Deleted

## 2022-11-23 ENCOUNTER — Ambulatory Visit (AMBULATORY_SURGERY_CENTER): Payer: Medicare Other | Admitting: *Deleted

## 2022-11-23 VITALS — Ht 72.0 in | Wt 118.0 lb

## 2022-11-23 DIAGNOSIS — Z1211 Encounter for screening for malignant neoplasm of colon: Secondary | ICD-10-CM

## 2022-11-23 NOTE — Telephone Encounter (Signed)
Attempt to reach pt for pre-visit. LM with call back #. Will attempt other number in profile 

## 2022-11-23 NOTE — Progress Notes (Signed)
Pt's name and DOB verified at the beginning of the pre-visit.  Pt denies any difficulty with ambulating,sitting, laying down or rolling side to side Gave both [facility] main # and MD on call # prior to instructions.  No egg or soy allergy known to patient  No issues known to pt with past sedation with any surgeries or procedures Pt denies having issues being intubated Pt has no issues moving head neck or swallowing No FH of Malignant Hyperthermia Pt is not on diet pills Pt is not on home 02  Pt is not on blood thinners  Pt denies issues with constipation  Pt is not on dialysis Pt denise any abnormal heart rhythms  Pt denies any upcoming cardiac testing Pt encouraged to use to use Singlecare or Goodrx to reduce cost  Patient's chart reviewed by Cathlyn Parsons CNRA prior to pre-visit and patient appropriate for the [facility].  Pre-visit completed and red dot placed by patient's name on their procedure day (on provider's schedule).  . Visit by phone Pt states weight is 118 lb Instructed pt why it is important to and  to call if they have any changes in health or new medications. Directed them to the # given and on instructions.   Pt states they will.  Instructions reviewed with pt and pt states understanding. Instructed to review again prior to procedure. Pt states they will.  Instructions sent by mail with coupon and by my chart

## 2022-12-07 ENCOUNTER — Encounter (HOSPITAL_COMMUNITY): Payer: Self-pay

## 2022-12-07 ENCOUNTER — Ambulatory Visit (INDEPENDENT_AMBULATORY_CARE_PROVIDER_SITE_OTHER): Payer: Medicare Other

## 2022-12-07 VITALS — BP 132/82 | HR 64 | Wt 117.6 lb

## 2022-12-07 DIAGNOSIS — F411 Generalized anxiety disorder: Secondary | ICD-10-CM | POA: Diagnosis not present

## 2022-12-07 DIAGNOSIS — F3181 Bipolar II disorder: Secondary | ICD-10-CM

## 2022-12-07 DIAGNOSIS — G4700 Insomnia, unspecified: Secondary | ICD-10-CM

## 2022-12-07 DIAGNOSIS — F200 Paranoid schizophrenia: Secondary | ICD-10-CM

## 2022-12-07 NOTE — Progress Notes (Cosign Needed Addendum)
  Patient arrived for ARIPiprazole ER (ABILIFY MAINTENA) 400 MG . Tolerated injection well in LEFT  Arm  & Pleasant as

## 2022-12-08 ENCOUNTER — Ambulatory Visit (HOSPITAL_COMMUNITY): Payer: Medicare Other

## 2022-12-08 ENCOUNTER — Other Ambulatory Visit (HOSPITAL_COMMUNITY): Payer: Medicare Other

## 2022-12-09 ENCOUNTER — Encounter (HOSPITAL_COMMUNITY): Payer: Self-pay

## 2022-12-10 DIAGNOSIS — Z79899 Other long term (current) drug therapy: Secondary | ICD-10-CM | POA: Diagnosis not present

## 2022-12-10 DIAGNOSIS — F3181 Bipolar II disorder: Secondary | ICD-10-CM | POA: Diagnosis not present

## 2022-12-11 ENCOUNTER — Encounter: Payer: Self-pay | Admitting: Certified Registered Nurse Anesthetist

## 2022-12-12 LAB — SPECIMEN STATUS REPORT

## 2022-12-13 NOTE — Progress Notes (Signed)
Provider discussed patient's reduced prolactin levels, reduced glucose, elevated B12, and elevated sodium level.  Provider encouraged patient to eat balanced meals.  Provider informed patient that if his appetite is poor to attempt to drink a protein shake.  Provider also encouraged patient to follow-up with his PCP.  He endorsed understanding and agreed.  No other concerns noted at this time.

## 2022-12-17 ENCOUNTER — Ambulatory Visit (AMBULATORY_SURGERY_CENTER): Payer: Medicare Other | Admitting: Gastroenterology

## 2022-12-17 ENCOUNTER — Encounter: Payer: Self-pay | Admitting: Gastroenterology

## 2022-12-17 VITALS — BP 119/75 | HR 73 | Temp 96.6°F | Resp 10 | Ht 72.0 in | Wt 118.0 lb

## 2022-12-17 DIAGNOSIS — D123 Benign neoplasm of transverse colon: Secondary | ICD-10-CM | POA: Diagnosis not present

## 2022-12-17 DIAGNOSIS — Z1211 Encounter for screening for malignant neoplasm of colon: Secondary | ICD-10-CM

## 2022-12-17 DIAGNOSIS — D122 Benign neoplasm of ascending colon: Secondary | ICD-10-CM

## 2022-12-17 DIAGNOSIS — I10 Essential (primary) hypertension: Secondary | ICD-10-CM | POA: Diagnosis not present

## 2022-12-17 DIAGNOSIS — I509 Heart failure, unspecified: Secondary | ICD-10-CM | POA: Diagnosis not present

## 2022-12-17 DIAGNOSIS — F32A Depression, unspecified: Secondary | ICD-10-CM | POA: Diagnosis not present

## 2022-12-17 MED ORDER — SODIUM CHLORIDE 0.9 % IV SOLN: 500.0000 mL | Freq: Once | INTRAVENOUS | Status: DC

## 2022-12-17 NOTE — Progress Notes (Signed)
Called to room to assist during endoscopic procedure.  Patient ID and intended procedure confirmed with present staff. Received instructions for my participation in the procedure from the performing physician.  

## 2022-12-17 NOTE — Progress Notes (Signed)
Report given to PACU, vss

## 2022-12-17 NOTE — Op Note (Signed)
[facility] Patient Name: Yuriy Brosey Procedure Date: 12/17/2022 8:51 AM MRN: [id number] Endoscopist: Corliss Parish , MD, [id number] Age: 67 Referring MD:  Date of Birth: [birth year] Gender: Male Account #: [id number] Procedure:                Colonoscopy Indications:              Screening for colorectal malignant neoplasm, This                            is the patient's first colonoscopy Medicines:                Monitored Anesthesia Care Procedure:                Pre-Anesthesia Assessment:                           - Prior to the procedure, a History and Physical                            was performed, and patient medications and                            allergies were reviewed. The patient's tolerance of                            previous anesthesia was also reviewed. The risks                            and benefits of the procedure and the sedation                            options and risks were discussed with the patient.                            All questions were answered, and informed consent                            was obtained. Prior Anticoagulants: The patient has                            taken no anticoagulant or antiplatelet agents. ASA                            Grade Assessment: III - A patient with severe                            systemic disease. After reviewing the risks and                            benefits, the patient was deemed in satisfactory                            condition to undergo the procedure.  After obtaining informed consent, the colonoscope                            was passed under direct vision. Throughout the                            procedure, the patient's blood pressure, pulse, and                            oxygen saturations were monitored continuously. The                            CF [id number] #[id number] was introduced through the anus                            and advanced to  the the cecum, identified by                            appendiceal orifice and ileocecal valve. The                            colonoscopy was somewhat difficult due to a                            tortuous colon. Successful completion of the                            procedure was aided by changing the patient's                            position, using manual pressure, straightening and                            shortening the scope to obtain bowel loop reduction                            and using scope torsion. The patient tolerated the                            procedure. The quality of the bowel preparation was                            adequate. The ileocecal valve, appendiceal orifice,                            and rectum were photographed. Scope In: 9:06:56 AM Scope Out: 9:27:43 AM Scope Withdrawal Time: 0 hours 15 minutes 24 seconds  Total Procedure Duration: 0 hours 20 minutes 47 seconds  Findings:                 The digital rectal exam findings include                            hemorrhoids. Pertinent negatives include no  palpable rectal lesions.                           The left colon was significantly tortuous.                           A large amount of semi-liquid stool was found in                            the entire colon, interfering with visualization.                            Lavage of the area was performed using copious                            amounts, resulting in clearance with adequate                            visualization.                           Seven sessile polyps were found in the transverse                            colon (4), hepatic flexure (1) and ascending colon                            (2). The polyps were 2 to 6 mm in size. These                            polyps were removed with a cold snare. Resection                            and retrieval were complete.                           Anal papillae  were hypertrophied.                           Non-bleeding non-thrombosed external and internal                            hemorrhoids were found during retroflexion, during                            perianal exam and during digital exam. The                            hemorrhoids were Grade II (internal hemorrhoids                            that prolapse but reduce spontaneously). Complications:            No immediate complications. Estimated Blood Loss:     Estimated blood loss was minimal. Impression:               -  Hemorrhoids found on digital rectal exam.                           - Tortuous left colon.                           - Stool in the entire examined colon - lavaged with                            adequate visualization.                           - Seven 2 to 6 mm polyps in the transverse colon,                            at the hepatic flexure and in the ascending colon,                            removed with a cold snare. Resected and retrieved.                           - Anal papillae were hypertrophied. Non-bleeding                            non-thrombosed external and internal hemorrhoids. Recommendation:           - The patient will be observed post-procedure,                            until all discharge criteria are met.                           - Discharge patient to home.                           - Patient has a contact number available for                            emergencies. The signs and symptoms of potential                            delayed complications were discussed with the                            patient. Return to normal activities tomorrow.                            Written discharge instructions were provided to the                            patient.                           - High fiber diet.                           -  Use FiberCon 1-2 tablets PO daily.                           - Continue present medications.                            - Await pathology results.                           - Repeat colonoscopy in likely 3 years for                            surveillance. Recommend either a 2-day preparation                            for 1 week of MiraLAX daily and Dulcolax every                            other day prior to colon preparation. Also remember                            to consider using a pediatric colonoscope.                           - The findings and recommendations were discussed                            with the patient.                           - The findings and recommendations were discussed                            with the designated responsible adult. Corliss Parish, MD 12/17/2022 9:35:13 AM
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
## 2022-12-17 NOTE — Patient Instructions (Addendum)
Handouts Provided:  Polyps and High Fiber Diet  Use FiberCon 1-2 tablets by mouth daily.  YOU HAD AN ENDOSCOPIC PROCEDURE TODAY AT THE [facility]:   Refer to the procedure report that was given to you for any specific questions about what was found during the examination.  If the procedure report does not answer your questions, please call your gastroenterologist to clarify.  If you requested that your care partner not be given the details of your procedure findings, then the procedure report has been included in a sealed envelope for you to review at your convenience later.  YOU SHOULD EXPECT: Some feelings of bloating in the abdomen. Passage of more gas than usual.  Walking can help get rid of the air that was put into your GI tract during the procedure and reduce the bloating. If you had a lower endoscopy (such as a colonoscopy or flexible sigmoidoscopy) you may notice spotting of blood in your stool or on the toilet paper. If you underwent a bowel prep for your procedure, you may not have a normal bowel movement for a few days.  Please Note:  You might notice some irritation and congestion in your nose or some drainage.  This is from the oxygen used during your procedure.  There is no need for concern and it should clear up in a day or so.  SYMPTOMS TO REPORT IMMEDIATELY:  Following lower endoscopy (colonoscopy or flexible sigmoidoscopy):  Excessive amounts of blood in the stool  Significant tenderness or worsening of abdominal pains  Swelling of the abdomen that is new, acute  Fever of 100F or higher  For urgent or emergent issues, a gastroenterologist can be reached at any hour by calling [phone]. Do not use MyChart messaging for urgent concerns.    DIET:  We do recommend a small meal at first, but then you may proceed to your regular diet.  Drink plenty of fluids but you should avoid alcoholic beverages for 24 hours.  ACTIVITY:  You should plan to take it easy for  the rest of today and you should NOT DRIVE or use heavy machinery until tomorrow (because of the sedation medicines used during the test).    FOLLOW UP: Our staff will call the number listed on your records the next business day following your procedure.  We will call around 7:15- 8:00 am to check on you and address any questions or concerns that you may have regarding the information given to you following your procedure. If we do not reach you, we will leave a message.     If any biopsies were taken you will be contacted by phone or by letter within the next 1-3 weeks.  Please call us at [phone] if you have not heard about the biopsies in 3 weeks.    SIGNATURES/CONFIDENTIALITY: You and/or your care partner have signed paperwork which will be entered into your electronic medical record.  These signatures attest to the fact that that the information above on your After Visit Summary has been reviewed and is understood.  Full responsibility of the confidentiality of this discharge information lies with you and/or your care-partner.

## 2022-12-17 NOTE — Progress Notes (Signed)
Report given to PACU, vss 

## 2022-12-20 ENCOUNTER — Telehealth: Payer: Self-pay

## 2022-12-20 NOTE — Telephone Encounter (Signed)
Follow up call to pt, no answer. 

## 2022-12-22 ENCOUNTER — Encounter: Payer: Self-pay | Admitting: Gastroenterology

## 2023-01-04 ENCOUNTER — Encounter (HOSPITAL_COMMUNITY): Payer: Self-pay

## 2023-01-04 ENCOUNTER — Encounter (HOSPITAL_COMMUNITY): Payer: Self-pay | Admitting: Psychiatry

## 2023-01-04 ENCOUNTER — Ambulatory Visit (INDEPENDENT_AMBULATORY_CARE_PROVIDER_SITE_OTHER): Payer: Medicare Other | Admitting: Psychiatry

## 2023-01-04 ENCOUNTER — Ambulatory Visit (INDEPENDENT_AMBULATORY_CARE_PROVIDER_SITE_OTHER): Payer: Medicare Other

## 2023-01-04 VITALS — BP 119/72 | HR 67 | Ht 72.0 in | Wt 118.0 lb

## 2023-01-04 DIAGNOSIS — F3181 Bipolar II disorder: Secondary | ICD-10-CM | POA: Diagnosis not present

## 2023-01-04 DIAGNOSIS — F200 Paranoid schizophrenia: Secondary | ICD-10-CM

## 2023-01-04 DIAGNOSIS — G4700 Insomnia, unspecified: Secondary | ICD-10-CM

## 2023-01-04 DIAGNOSIS — F411 Generalized anxiety disorder: Secondary | ICD-10-CM

## 2023-01-04 MED ORDER — ABILIFY MAINTENA 400 MG IM PRSY
400.0000 mg | PREFILLED_SYRINGE | INTRAMUSCULAR | 11 refills | Status: AC
Start: 2023-01-04 — End: ?

## 2023-01-04 NOTE — Progress Notes (Cosign Needed)
Patient arrived for ARIPiprazole ER (ABILIFY MAINTENA) 400 MG . Tolerated injection well in RIGHT Arm  & Pleasant as
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
## 2023-02-01 ENCOUNTER — Encounter (HOSPITAL_COMMUNITY): Payer: Self-pay

## 2023-02-01 ENCOUNTER — Ambulatory Visit (HOSPITAL_COMMUNITY): Payer: Medicare Other

## 2023-02-01 VITALS — BP 129/77 | HR 62 | Ht 72.0 in | Wt 115.4 lb

## 2023-02-01 DIAGNOSIS — G4700 Insomnia, unspecified: Secondary | ICD-10-CM

## 2023-02-01 DIAGNOSIS — F200 Paranoid schizophrenia: Secondary | ICD-10-CM

## 2023-02-01 DIAGNOSIS — F411 Generalized anxiety disorder: Secondary | ICD-10-CM

## 2023-02-01 NOTE — Progress Notes (Cosign Needed)
  Patient arrived for ARIPiprazole ER (ABILIFY MAINTENA) 400 MG . Tolerated injection well in LEFT  Arm  & Pleasant as

## 2023-03-01 ENCOUNTER — Ambulatory Visit (HOSPITAL_COMMUNITY): Payer: Medicare Other

## 2023-03-01 ENCOUNTER — Encounter (HOSPITAL_COMMUNITY): Payer: Self-pay

## 2023-03-01 VITALS — BP 131/81 | HR 79 | Ht 72.0 in | Wt 112.4 lb

## 2023-03-01 DIAGNOSIS — F411 Generalized anxiety disorder: Secondary | ICD-10-CM

## 2023-03-01 DIAGNOSIS — G4700 Insomnia, unspecified: Secondary | ICD-10-CM

## 2023-03-01 DIAGNOSIS — F200 Paranoid schizophrenia: Secondary | ICD-10-CM

## 2023-03-01 NOTE — Progress Notes (Cosign Needed)
Patient arrived for ARIPiprazole ER (ABILIFY MAINTENA) 400 MG . Tolerated injection well in RIGHT Arm  & Pleasant as

## 2023-03-22 ENCOUNTER — Ambulatory Visit (HOSPITAL_COMMUNITY): Payer: Medicare Other

## 2023-03-23 ENCOUNTER — Encounter: Payer: Medicare Other | Admitting: Sports Medicine

## 2023-03-24 NOTE — Progress Notes (Signed)
This encounter was created in error - please disregard.

## 2023-03-31 ENCOUNTER — Ambulatory Visit (HOSPITAL_COMMUNITY): Payer: Medicare Other

## 2023-04-19 ENCOUNTER — Telehealth (HOSPITAL_COMMUNITY): Payer: Self-pay

## 2023-04-19 NOTE — Telephone Encounter (Signed)
 Called pt multiple times including his son derrick . With no response

## 2023-04-19 NOTE — Telephone Encounter (Signed)
 Thank you for this update.  Patient can call the clinic when he is ready to reschedule his appointments.

## 2023-04-21 ENCOUNTER — Telehealth (HOSPITAL_COMMUNITY): Payer: Self-pay

## 2023-04-21 NOTE — Telephone Encounter (Signed)
Medication managment - Called patient's phone number and left a message with concerns he has not been in for his past due injection and requested he call back to schedule. Also called pt's son's number and he agreed to have patient call to reschedule as soon as possible.

## 2023-08-31 ENCOUNTER — Other Ambulatory Visit (HOSPITAL_COMMUNITY): Payer: Self-pay

## 2023-09-23 ENCOUNTER — Encounter: Payer: Medicare Other | Admitting: Adult Health

## 2023-09-23 NOTE — Progress Notes (Signed)
 This encounter was created in error - please disregard.
